# Patient Record
Sex: Female | Born: 1965 | Race: White | Hispanic: No | Marital: Single | Smoking: Current every day smoker
Health system: Southern US, Community
[De-identification: ages and names within clinical notes are randomized; demographics above are authoritative.]

## PROBLEM LIST (undated history)

## (undated) DIAGNOSIS — E119 Type 2 diabetes mellitus without complications: Secondary | ICD-10-CM

## (undated) DIAGNOSIS — T451X5A Adverse effect of antineoplastic and immunosuppressive drugs, initial encounter: Secondary | ICD-10-CM

## (undated) DIAGNOSIS — F32A Depression, unspecified: Secondary | ICD-10-CM

## (undated) DIAGNOSIS — M21372 Foot drop, left foot: Secondary | ICD-10-CM

## (undated) DIAGNOSIS — Z923 Personal history of irradiation: Secondary | ICD-10-CM

## (undated) DIAGNOSIS — I10 Essential (primary) hypertension: Secondary | ICD-10-CM

## (undated) DIAGNOSIS — M1990 Unspecified osteoarthritis, unspecified site: Secondary | ICD-10-CM

## (undated) DIAGNOSIS — F329 Major depressive disorder, single episode, unspecified: Secondary | ICD-10-CM

## (undated) DIAGNOSIS — S82841A Displaced bimalleolar fracture of right lower leg, initial encounter for closed fracture: Secondary | ICD-10-CM

## (undated) DIAGNOSIS — Z9641 Presence of insulin pump (external) (internal): Secondary | ICD-10-CM

## (undated) DIAGNOSIS — G620 Drug-induced polyneuropathy: Secondary | ICD-10-CM

## (undated) DIAGNOSIS — T8859XA Other complications of anesthesia, initial encounter: Secondary | ICD-10-CM

## (undated) DIAGNOSIS — G4730 Sleep apnea, unspecified: Secondary | ICD-10-CM

## (undated) DIAGNOSIS — E785 Hyperlipidemia, unspecified: Secondary | ICD-10-CM

## (undated) DIAGNOSIS — Z9221 Personal history of antineoplastic chemotherapy: Secondary | ICD-10-CM

## (undated) DIAGNOSIS — M5416 Radiculopathy, lumbar region: Secondary | ICD-10-CM

## (undated) DIAGNOSIS — K219 Gastro-esophageal reflux disease without esophagitis: Secondary | ICD-10-CM

## (undated) DIAGNOSIS — C539 Malignant neoplasm of cervix uteri, unspecified: Secondary | ICD-10-CM

## (undated) DIAGNOSIS — K861 Other chronic pancreatitis: Secondary | ICD-10-CM

## (undated) DIAGNOSIS — M179 Osteoarthritis of knee, unspecified: Secondary | ICD-10-CM

## (undated) DIAGNOSIS — A048 Other specified bacterial intestinal infections: Secondary | ICD-10-CM

## (undated) HISTORY — DX: Malignant neoplasm of cervix uteri, unspecified: C53.9

## (undated) HISTORY — DX: Hyperlipidemia, unspecified: E78.5

## (undated) HISTORY — PX: CHOLECYSTECTOMY: SHX55

## (undated) HISTORY — DX: Essential (primary) hypertension: I10

## (undated) HISTORY — PX: COLONOSCOPY: SHX174

## (undated) HISTORY — DX: Other specified bacterial intestinal infections: A04.8

## (undated) HISTORY — DX: Major depressive disorder, single episode, unspecified: F32.9

## (undated) HISTORY — PX: UPPER GI ENDOSCOPY: SHX6162

## (undated) HISTORY — PX: OOPHORECTOMY: SHX86

## (undated) HISTORY — DX: Depression, unspecified: F32.A

## (undated) HISTORY — DX: Type 2 diabetes mellitus without complications: E11.9

## (undated) HISTORY — PX: ABDOMINAL HYSTERECTOMY: SHX81

---

## 2004-05-13 ENCOUNTER — Emergency Department: Payer: Self-pay | Admitting: Unknown Physician Specialty

## 2004-06-22 ENCOUNTER — Emergency Department: Payer: Self-pay | Admitting: Emergency Medicine

## 2004-12-23 ENCOUNTER — Emergency Department: Payer: Self-pay | Admitting: Emergency Medicine

## 2005-05-08 ENCOUNTER — Emergency Department: Payer: Self-pay | Admitting: Emergency Medicine

## 2007-01-09 ENCOUNTER — Other Ambulatory Visit: Payer: Self-pay

## 2007-01-09 ENCOUNTER — Emergency Department: Payer: Self-pay | Admitting: Emergency Medicine

## 2007-02-14 ENCOUNTER — Emergency Department: Payer: Self-pay | Admitting: Emergency Medicine

## 2008-08-14 ENCOUNTER — Emergency Department: Payer: Self-pay | Admitting: Emergency Medicine

## 2008-08-28 ENCOUNTER — Ambulatory Visit: Payer: Self-pay | Admitting: Family Medicine

## 2008-10-10 ENCOUNTER — Emergency Department: Payer: Self-pay | Admitting: Emergency Medicine

## 2009-01-12 ENCOUNTER — Emergency Department: Payer: Self-pay | Admitting: Emergency Medicine

## 2011-04-27 DIAGNOSIS — A048 Other specified bacterial intestinal infections: Secondary | ICD-10-CM

## 2011-04-27 HISTORY — DX: Other specified bacterial intestinal infections: A04.8

## 2011-05-01 IMAGING — CR DG CHEST 2V
1 series · 2 of 2 positions shown · non-contrast
Comparison: none

REASON FOR EXAM: abd pain
COMMENTS:

PROCEDURE:     DXR - DXR CHEST PA (OR AP) AND LATERAL  - August 28, 2008 [DATE]
RESULT:     The lung fields are clear. No pneumonia, pneumothorax or pleural
effusion is seen. Heart size is normal. The chest appears mildly
hyperexpanded.

[Series 1: view not recorded · 0.17mm/px · 2 of 2 slices shown]
[im 1/2]
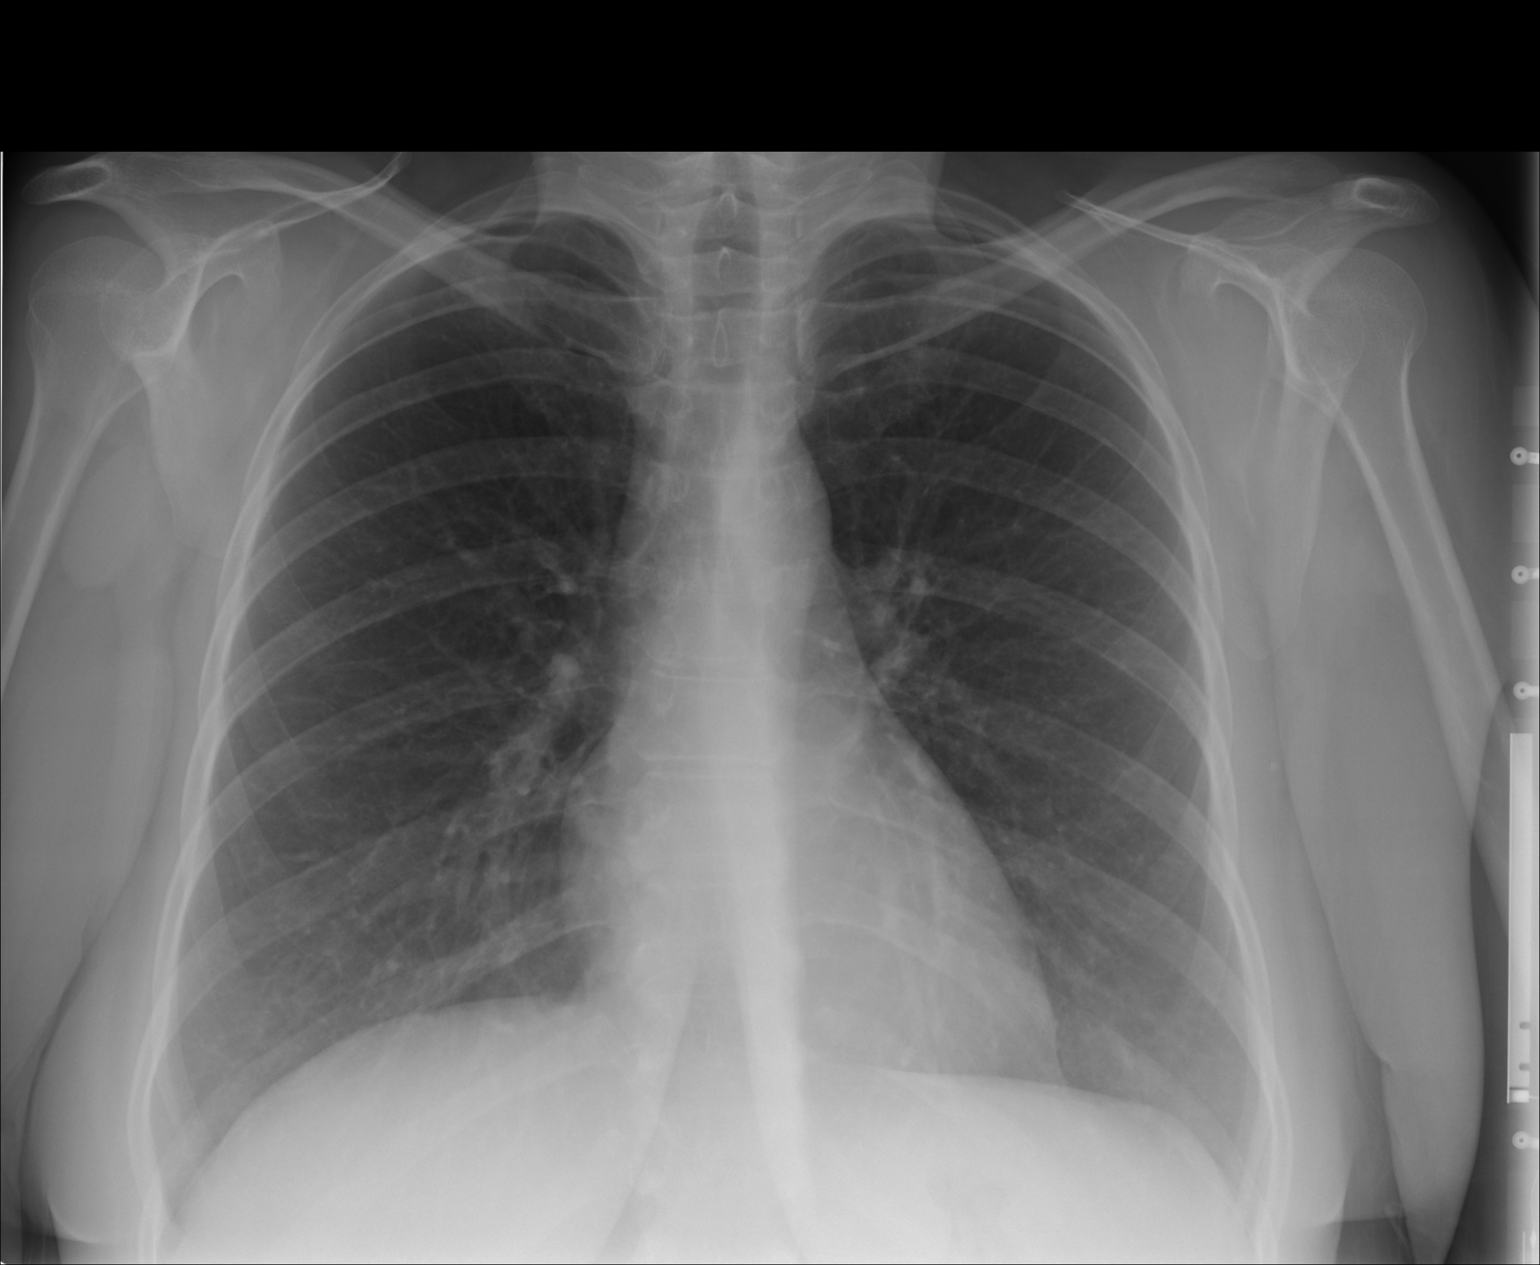
[im 2/2]
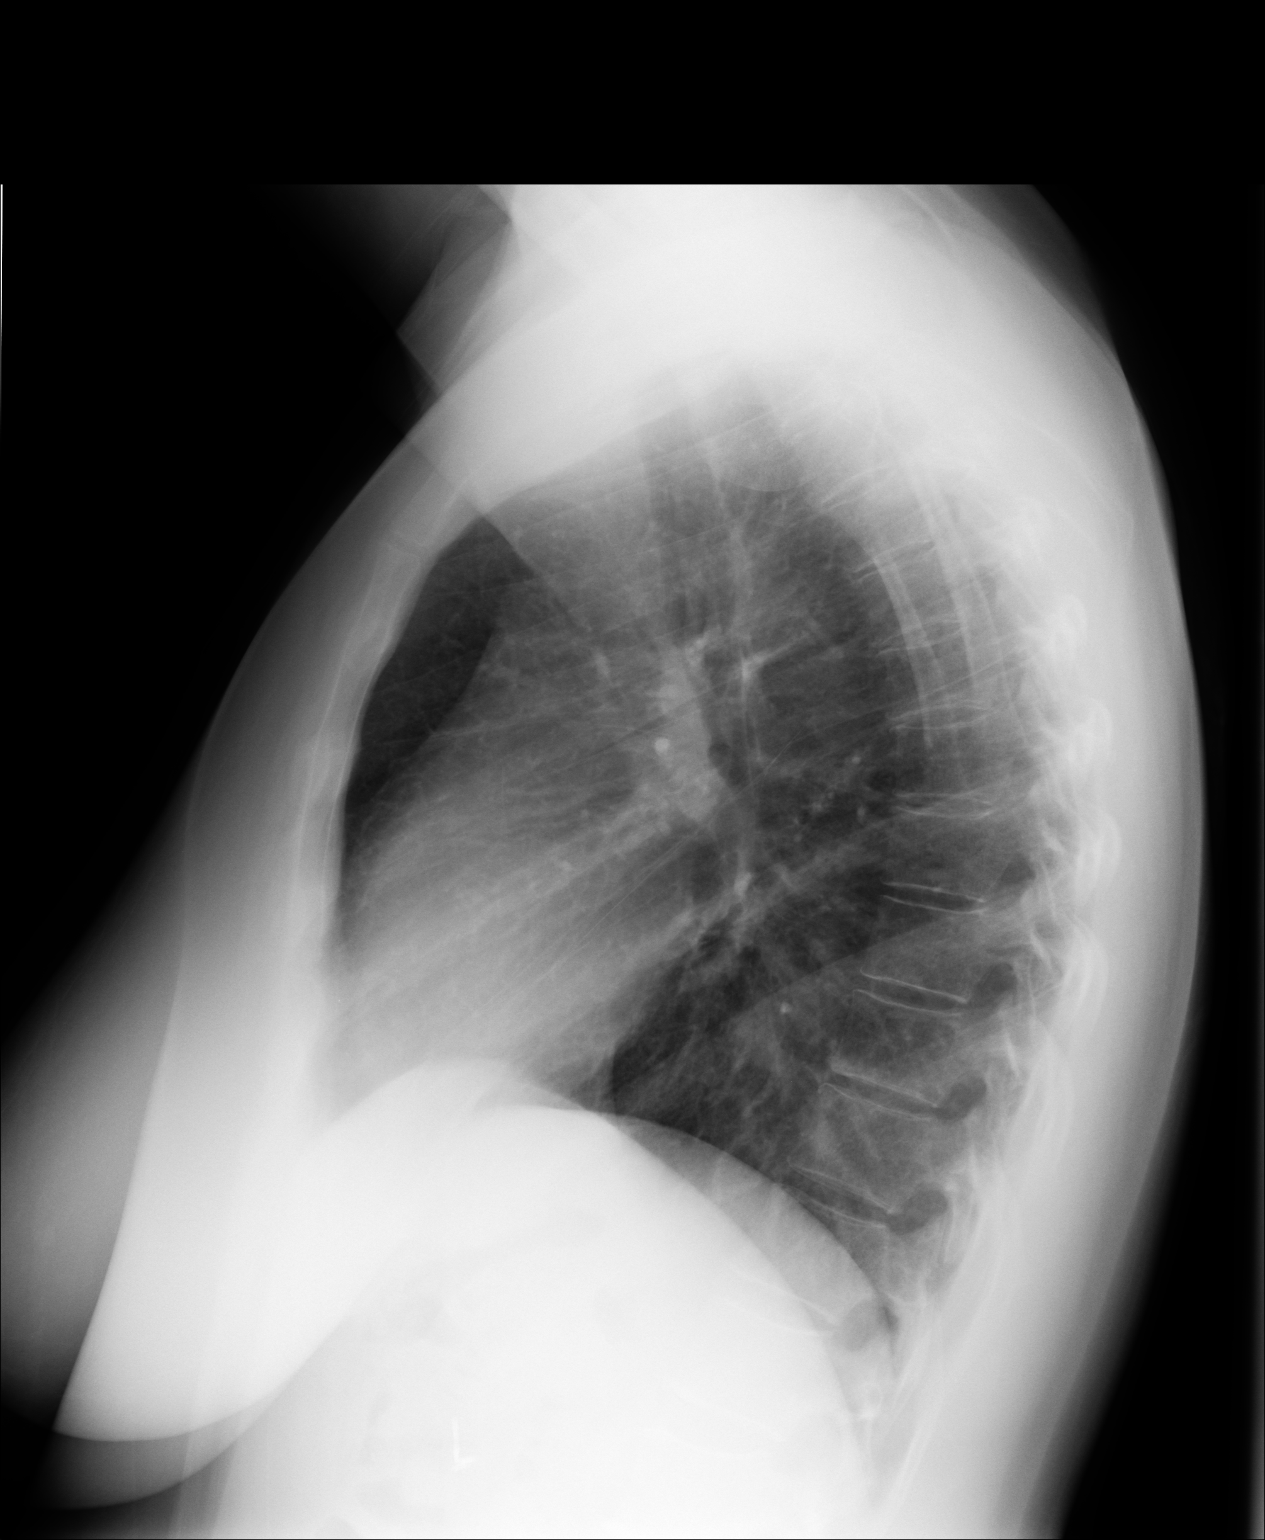

[2 of 2 positions shown; findings below may reference images not displayed]

IMPRESSION: 1. The lung fields are clear.
2. The chest appears mildly hyperexpanded.

## 2011-07-06 ENCOUNTER — Ambulatory Visit: Payer: Self-pay | Admitting: Cardiology

## 2011-08-23 ENCOUNTER — Ambulatory Visit: Payer: Self-pay | Admitting: Family Medicine

## 2011-11-06 LAB — COMPREHENSIVE METABOLIC PANEL
Albumin: 3.5 g/dL (ref 3.4–5.0)
Alkaline Phosphatase: 119 U/L (ref 50–136)
Anion Gap: 7 (ref 7–16)
BUN: 7 mg/dL (ref 7–18)
Bilirubin,Total: 0.8 mg/dL (ref 0.2–1.0)
Calcium, Total: 9.4 mg/dL (ref 8.5–10.1)
Chloride: 99 mmol/L (ref 98–107)
Co2: 30 mmol/L (ref 21–32)
Creatinine: 0.77 mg/dL (ref 0.60–1.30)
EGFR (African American): >60
EGFR (Non-African Amer.): >60
Glucose: 152 mg/dL — ABNORMAL HIGH (ref 65–99)
Osmolality: 273 (ref 275–301)
Potassium: 4 mmol/L (ref 3.5–5.1)
SGOT(AST): 21 U/L (ref 15–37)
SGPT (ALT): 21 U/L
Sodium: 136 mmol/L (ref 136–145)
Total Protein: 9 g/dL — ABNORMAL HIGH (ref 6.4–8.2)

## 2011-11-06 LAB — CBC
HCT: 36.8 % (ref 35.0–47.0)
HGB: 12 g/dL (ref 12.0–16.0)
MCH: 29 pg (ref 26.0–34.0)
MCV: 89 fL (ref 80–100)
Platelet: 380 10*3/uL (ref 150–440)
RBC: 4.15 10*6/uL (ref 3.80–5.20)

## 2011-11-07 ENCOUNTER — Inpatient Hospital Stay: Payer: Self-pay | Admitting: Internal Medicine

## 2011-11-08 LAB — CBC WITH DIFFERENTIAL/PLATELET
Basophil #: 0.1 10*3/uL (ref 0.0–0.1)
Basophil %: 0.6 %
Eosinophil #: 1.1 10*3/uL — ABNORMAL HIGH (ref 0.0–0.7)
Eosinophil %: 7.6 %
HCT: 31.6 % — ABNORMAL LOW (ref 35.0–47.0)
Lymphocyte #: 2.2 10*3/uL (ref 1.0–3.6)
Lymphocyte %: 15.2 %
MCH: 29.7 pg (ref 26.0–34.0)
MCHC: 33.8 g/dL (ref 32.0–36.0)
Monocyte %: 6 %
Neutrophil #: 10.3 10*3/uL — ABNORMAL HIGH (ref 1.4–6.5)
Platelet: 371 10*3/uL (ref 150–440)
RBC: 3.59 10*6/uL — ABNORMAL LOW (ref 3.80–5.20)
RDW: 13.5 % (ref 11.5–14.5)
WBC: 14.5 10*3/uL — ABNORMAL HIGH (ref 3.6–11.0)

## 2011-11-08 LAB — BASIC METABOLIC PANEL
Anion Gap: 6 — ABNORMAL LOW (ref 7–16)
Chloride: 101 mmol/L (ref 98–107)
Co2: 30 mmol/L (ref 21–32)
Creatinine: 0.82 mg/dL (ref 0.60–1.30)
Potassium: 3.8 mmol/L (ref 3.5–5.1)
Sodium: 137 mmol/L (ref 136–145)

## 2011-11-20 LAB — CULTURE, BLOOD (SINGLE)

## 2012-01-07 ENCOUNTER — Ambulatory Visit: Payer: Self-pay | Admitting: Hematology and Oncology

## 2012-01-07 LAB — CBC CANCER CENTER
Basophil %: 1 %
Eosinophil #: 0.9 x10 3/mm — ABNORMAL HIGH (ref 0.0–0.7)
Eosinophil %: 8.4 %
HGB: 12.5 g/dL (ref 12.0–16.0)
Lymphocyte #: 2.6 x10 3/mm (ref 1.0–3.6)
Lymphocyte %: 24.5 %
MCHC: 32.4 g/dL (ref 32.0–36.0)
Monocyte #: 0.7 x10 3/mm (ref 0.2–0.9)
Neutrophil #: 6.2 x10 3/mm (ref 1.4–6.5)
Neutrophil %: 59.7 %
Platelet: 378 x10 3/mm (ref 150–440)
RDW: 14.2 % (ref 11.5–14.5)
WBC: 10.5 x10 3/mm (ref 3.6–11.0)

## 2012-01-25 ENCOUNTER — Ambulatory Visit: Payer: Self-pay | Admitting: Hematology and Oncology

## 2012-06-24 ENCOUNTER — Ambulatory Visit: Payer: Self-pay | Admitting: Hematology and Oncology

## 2013-02-06 ENCOUNTER — Ambulatory Visit: Payer: Self-pay | Admitting: Rheumatology

## 2013-09-18 ENCOUNTER — Ambulatory Visit: Payer: Self-pay | Admitting: Gastroenterology

## 2013-09-19 LAB — PATHOLOGY REPORT

## 2014-08-13 NOTE — Discharge Summary (Signed)
PATIENT NAME:  Catherine Krueger, Catherine Krueger MR#:  [id number] DATE OF BIRTH:  05-27-1965  DATE OF ADMISSION:  11/07/2011 DATE OF DISCHARGE:  11/08/2011  PRESENTING COMPLAINT: Right leg swelling and erythema.   DISCHARGE DIAGNOSES:  1. Right lower extremity cellulitis, improved.  2. Type II diabetes.   CONDITION ON DISCHARGE: Fair.   MEDICATIONS:  1. Lovastatin 40 mg p.o. at bedtime.  2. Ultram 50 mg 3 times a day as needed for pain.  3. Amitriptyline 25 mg at bedtime.  4. Metformin 1000 mg b.i.d.  5. Lyrica 50 mg 3 times a day.  6. Meloxicam 7.5 mg b.i.d.  7. Nicotine patch inhaled once a day.  8. Lantus 55 units at bedtime.  9. Augmentin 875 mg b.i.d. for seven days.   DO NOT TAKE: Hold enalapril since your blood pressure is running low.   FOLLOW-UP: Follow-up with Dr. Tomasita Morrow at [facility] on 11/17/2011 at 10:30 a.m.   LABORATORY, DIAGNOSTIC, AND RADIOLOGICAL DATA: Blood cultures negative in 24 hours. White count 14.5, hemoglobin and hematocrit 10.7 and 31.6. Basic metabolic panel within normal limits except glucose of 166. Hemoglobin A1c 6.8. White count on admission was 22.5.   BRIEF SUMMARY OF HOSPITAL COURSE:  1. [name] is a 49 year old Caucasian female who came into the Emergency Room with history of diabetes, hypertension, and hyperlipidemia came in with right lower extremity cellulitis. She was admitted with right lower extremity cellulitis. She did have some dry skin and cracked skin in her web spaces, likely source for her infection. She was started initially on vancomycin and Zosyn and doing very well. Her erythema was receding. She did have some minor pain in her calf area. Her white count improved down from 22,000 to 14.5. She remained afebrile. Her antibiotics were changed to p.o. Augmentin.  2. Leukocytosis likely due to the cellulitis, improved.  3. Type II diabetes. Continued Lantus and metformin.  4. Hypertension. The patient has relative low blood pressure. She  is asymptomatic. Will hold her off the enalapril for now.  5. Hyperlipidemia. Continued lovastatin.   The patient was advised to call [facility] to get all her medications refilled. Her medication list was checked with RN at [facility] and changes were updated.   Hospital stay otherwise remained stable.   CODE STATUS: The patient remained a FULL CODE.   TIME SPENT: 40 minutes.   ____________________________ Hart Rochester Posey Pronto, MD sap:drc D: 11/08/2011 13:59:15 ET T: 11/08/2011 14:27:08 ET JOB#: [id number]  cc: Kayshawn Ozburn A. Posey Pronto, MD, <Dictator> Myrle Sheng. Jimmye Norman, MD Ilda Basset MD ELECTRONICALLY SIGNED 11/11/2011 13:46

## 2014-08-18 NOTE — H&P (Signed)
PATIENT NAME:  Catherine Krueger, Catherine Krueger MR#:  [id number] DATE OF BIRTH:  12-Jan-1966  DATE OF ADMISSION:  11/07/2011  REFERRING PHYSICIAN: Dr. Lisa Roca  PRIMARY CARE PHYSICIAN: Dr. Tomasita Morrow    CHIEF COMPLAINT: Right leg pain, erythema, swelling.   HISTORY OF PRESENT ILLNESS: This is a 49 year old female with significant past medical history of diabetes, hypertension, diabetic neuropathy and depression presents with complaint of right lower extremity pain, tenderness, erythema over the last two days. Patient denies any insect bites or trauma or injury to her right leg. As well denies any recent long travel, hemoptysis, shortness of breath or chest pain. Patient was found to have leukocytosis of 22.5. Patient as well complaining of feverish and chills. Temperature in ED was 99.8. Patient's blood cultures were sent and was started on IV clindamycin for cellulitis. Patient had right lower extremity venous Doppler which came back negative for deep vein thrombosis.   Hospitalist was requested to admit patient as she is diabetic and she will need IV antibiotics.   PAST MEDICAL HISTORY:  1. Benign hypertension.  2. Diabetes mellitus.  3. Depression.  4. History of cervical cancer, status post chemotherapy and radiation at [facility].  5. Diabetic neuropathy.   HOME MEDICATIONS:  1. Enalapril 2.5 mg oral once a day. Patient is off for last week.  2. Tramadol 50 mg oral every six hours as needed.  3. Lantus 70 units sub-Q at bedtime.  4. Amitriptyline 25 mg at night.  5. Lovastatin 40 mg daily.  6. Pepcid 10 mg daily.  7. Metformin 1000 mg 2 times a day.   ALLERGIES: Keflex, Voltaren.   SOCIAL HISTORY: Patient is single, unemployed, has three children. Smokes a pack a day for the last 28 years. No ethanol. No illicit drug use.   FAMILY HISTORY: Mother has history of diabetes and congestive heart failure.   REVIEW OF SYSTEMS: CONSTITUTIONAL: Patient complains of feeling feverish, generally  weak and fatigued. EYES: Denies blurry vision, double vision or pain. ENT: Denies any tinnitus, ear pain, hearing loss. RESPIRATORY: Denies cough, wheezing, hemoptysis, dyspnea. CARDIOVASCULAR: Denies any chest pain, orthopnea, palpitations, syncope. GASTROINTESTINAL: Denies any nausea, vomiting, diarrhea, abdominal pain. GENITOURINARY: Denies dysuria, hematuria, renal colic. ENDO: Denies polyuria, polydipsia, heat or cold intolerance. INTEGUMENTARY: Had right lower extremity tenderness and rash. Denies any itching. MUSCULOSKELETAL: Denies any neck pain, back pain, gout. NEUROLOGICAL: Denies any numbness, weakness, dysarthria, epilepsy. Has diabetic neuropathy. PSYCH: Denies any anxiety, insomnia. Has history of depression.   PHYSICAL EXAMINATION:  VITAL SIGNS: Temperature 99.8, pulse 110, respiratory rate 20, blood pressure 142/76, saturating 99%.   GENERAL: Well-nourished female looks comfortable in bed in no apparent distress.   HEENT: Head atraumatic, normocephalic. Pupils equal, reactive to light. Pink conjunctivae. Anicteric sclerae. Moist oral mucosa.   NECK: Supple. No thyromegaly. No JVD.   CHEST: Good air entry bilaterally. No wheezing, rales, rhonchi.   CARDIOVASCULAR: S1, S2 heard. No rubs, murmur, gallops.   ABDOMEN: Soft, nontender, nondistended. Bowel sounds present.   EXTREMITIES: Good pulses. No clubbing. No cyanosis. Right lower extremity has medial erythema extending below and above the knee, tender, warm.   PSYCHIATRIC: Appropriate affect. Awake, alert x3. Intact judgment and insight.   NEUROLOGIC: Cranial nerves grossly intact. Motor 5/5.   SKIN: Has right lower extremity rash.   LABORATORY, DIAGNOSTIC AND RADIOLOGICAL DATA: Glucose 152, BUN 7, creatinine 0.77, sodium 136, potassium 4, chloride 99, CO2 30, total protein 9, albumin 3.5, total bilirubin 0.8, alkaline phosphatase 119, AST 21,  ALT 21, white blood cells 22.5, hemoglobin 12, hematocrit 36.8, platelets 380.    Right lower extremity venous Doppler showing no evidence of deep vein thrombosis in right lower extremity. As well there is a large hypervascular lymph node in the right groin.   ASSESSMENT AND PLAN:  1. Sepsis. Patient has tachycardia of 110 and leukocytosis of 22.5. Source most likely due to cellulitis and much less likely thrombophlebitis. Will start her on broad-spectrum IV antibiotic. Giving that she is diabetic will start on Zosyn and vancomycin and will down escalate antibiotics once blood cultures are back. Will follow on blood cultures and will have on ibuprofen 400 every eight hours total of three days for possible thrombophlebitis as well.  2. Diabetes. Will continue on metformin and Lantus and will add insulin sliding scale.  3. Diabetic neuropathy. Will continue on Lyrica.  4. Depression. Will continue on amitriptyline.  5. Hyperlipidemia. Continue on statin.  6. Hypertension. Continue on enalapril.  7. Continue on sub-Q heparin for deep vein thrombosis prophylaxis and Protonix for GI prophylaxis especially patient is on NSAIDs.  8. CODE STATUS: FULL CODE.   TOTAL TIME SPENT ON ADMISSION AND PATIENT CARE: 50 minutes.   ____________________________ Albertine Patricia, MD dse:cms D: 11/07/2011 02:13:48 ET T: 11/07/2011 09:58:01 ET JOB#: [id number]  cc: Albertine Patricia, MD, <Dictator> Myrle Sheng. Jimmye Norman, MD Aloys Hupfer Graciela Husbands MD ELECTRONICALLY SIGNED 11/08/2011 0:28

## 2014-09-19 ENCOUNTER — Encounter (HOSPITAL_COMMUNITY): Payer: Self-pay

## 2014-10-01 ENCOUNTER — Other Ambulatory Visit: Payer: Self-pay

## 2014-10-01 ENCOUNTER — Ambulatory Visit: Payer: Medicare Other | Admitting: Psychiatry

## 2015-02-22 ENCOUNTER — Inpatient Hospital Stay
Admission: EM | Admit: 2015-02-22 | Discharge: 2015-02-24 | DRG: 637 | Disposition: A | Discharge status: Home / Self Care | Payer: Medicare Other | Attending: Internal Medicine | Admitting: Internal Medicine

## 2015-02-22 ENCOUNTER — Emergency Department: Payer: Medicare Other

## 2015-02-22 ENCOUNTER — Encounter: Payer: Self-pay | Admitting: Emergency Medicine

## 2015-02-22 DIAGNOSIS — E669 Obesity, unspecified: Secondary | ICD-10-CM

## 2015-02-22 DIAGNOSIS — Z9114 Patient's other noncompliance with medication regimen: Secondary | ICD-10-CM | POA: Diagnosis not present

## 2015-02-22 DIAGNOSIS — I10 Essential (primary) hypertension: Secondary | ICD-10-CM | POA: Diagnosis present

## 2015-02-22 DIAGNOSIS — Z720 Tobacco use: Secondary | ICD-10-CM

## 2015-02-22 DIAGNOSIS — E860 Dehydration: Secondary | ICD-10-CM

## 2015-02-22 DIAGNOSIS — R410 Disorientation, unspecified: Secondary | ICD-10-CM

## 2015-02-22 DIAGNOSIS — F17200 Nicotine dependence, unspecified, uncomplicated: Secondary | ICD-10-CM | POA: Diagnosis present

## 2015-02-22 DIAGNOSIS — N390 Urinary tract infection, site not specified: Secondary | ICD-10-CM | POA: Diagnosis present

## 2015-02-22 DIAGNOSIS — F329 Major depressive disorder, single episode, unspecified: Secondary | ICD-10-CM | POA: Diagnosis present

## 2015-02-22 DIAGNOSIS — E1310 Other specified diabetes mellitus with ketoacidosis without coma: Secondary | ICD-10-CM | POA: Diagnosis present

## 2015-02-22 DIAGNOSIS — E1165 Type 2 diabetes mellitus with hyperglycemia: Secondary | ICD-10-CM | POA: Diagnosis present

## 2015-02-22 DIAGNOSIS — K219 Gastro-esophageal reflux disease without esophagitis: Secondary | ICD-10-CM | POA: Diagnosis present

## 2015-02-22 DIAGNOSIS — Z9049 Acquired absence of other specified parts of digestive tract: Secondary | ICD-10-CM

## 2015-02-22 DIAGNOSIS — Z8249 Family history of ischemic heart disease and other diseases of the circulatory system: Secondary | ICD-10-CM

## 2015-02-22 DIAGNOSIS — Z794 Long term (current) use of insulin: Secondary | ICD-10-CM

## 2015-02-22 DIAGNOSIS — Z881 Allergy status to other antibiotic agents status: Secondary | ICD-10-CM | POA: Diagnosis not present

## 2015-02-22 DIAGNOSIS — Z7951 Long term (current) use of inhaled steroids: Secondary | ICD-10-CM

## 2015-02-22 DIAGNOSIS — E1110 Type 2 diabetes mellitus with ketoacidosis without coma: Secondary | ICD-10-CM

## 2015-02-22 DIAGNOSIS — Z79899 Other long term (current) drug therapy: Secondary | ICD-10-CM

## 2015-02-22 DIAGNOSIS — E785 Hyperlipidemia, unspecified: Secondary | ICD-10-CM | POA: Diagnosis present

## 2015-02-22 DIAGNOSIS — E876 Hypokalemia: Secondary | ICD-10-CM | POA: Diagnosis present

## 2015-02-22 DIAGNOSIS — N3000 Acute cystitis without hematuria: Secondary | ICD-10-CM | POA: Diagnosis present

## 2015-02-22 DIAGNOSIS — Z90710 Acquired absence of both cervix and uterus: Secondary | ICD-10-CM

## 2015-02-22 DIAGNOSIS — Z888 Allergy status to other drugs, medicaments and biological substances status: Secondary | ICD-10-CM

## 2015-02-22 DIAGNOSIS — Z8541 Personal history of malignant neoplasm of cervix uteri: Secondary | ICD-10-CM | POA: Diagnosis not present

## 2015-02-22 DIAGNOSIS — E871 Hypo-osmolality and hyponatremia: Secondary | ICD-10-CM | POA: Diagnosis present

## 2015-02-22 DIAGNOSIS — N179 Acute kidney failure, unspecified: Secondary | ICD-10-CM | POA: Diagnosis present

## 2015-02-22 DIAGNOSIS — Z833 Family history of diabetes mellitus: Secondary | ICD-10-CM | POA: Diagnosis not present

## 2015-02-22 DIAGNOSIS — D649 Anemia, unspecified: Secondary | ICD-10-CM

## 2015-02-22 DIAGNOSIS — D72829 Elevated white blood cell count, unspecified: Secondary | ICD-10-CM

## 2015-02-22 DIAGNOSIS — F32A Depression, unspecified: Secondary | ICD-10-CM | POA: Diagnosis present

## 2015-02-22 DIAGNOSIS — G9341 Metabolic encephalopathy: Secondary | ICD-10-CM | POA: Diagnosis present

## 2015-02-22 HISTORY — DX: Gastro-esophageal reflux disease without esophagitis: K21.9

## 2015-02-22 LAB — CBC
HEMATOCRIT: 34.8 % — AB (ref 35.0–47.0)
Hemoglobin: 11.5 g/dL — ABNORMAL LOW (ref 12.0–16.0)
MCH: 29.1 pg (ref 26.0–34.0)
MCHC: 32.9 g/dL (ref 32.0–36.0)
MCV: 88.4 fL (ref 80.0–100.0)
Platelets: 428 10*3/uL (ref 150–440)
RBC: 3.94 MIL/uL (ref 3.80–5.20)
RDW: 13.4 % (ref 11.5–14.5)
WBC: 13.1 10*3/uL — AB (ref 3.6–11.0)

## 2015-02-22 LAB — URINALYSIS COMPLETE WITH MICROSCOPIC (ARMC ONLY)
BILIRUBIN URINE: NEGATIVE
Hgb urine dipstick: NEGATIVE
NITRITE: POSITIVE — AB
Protein, ur: NEGATIVE mg/dL
SPECIFIC GRAVITY, URINE: 1.017 (ref 1.005–1.030)
pH: 5 (ref 5.0–8.0)

## 2015-02-22 LAB — BASIC METABOLIC PANEL
ANION GAP: 19 — AB (ref 5–15)
BUN: 35 mg/dL — AB (ref 6–20)
CO2: 21 mmol/L — ABNORMAL LOW (ref 22–32)
Calcium: 9.4 mg/dL (ref 8.9–10.3)
Chloride: 79 mmol/L — ABNORMAL LOW (ref 101–111)
Creatinine, Ser: 1.7 mg/dL — ABNORMAL HIGH (ref 0.44–1.00)
GFR calc Af Amer: 40 mL/min — ABNORMAL LOW (ref >60)
GFR, EST NON AFRICAN AMERICAN: 34 mL/min — AB (ref >60)
Glucose, Bld: 685 mg/dL (ref 65–99)
POTASSIUM: 4.4 mmol/L (ref 3.5–5.1)
SODIUM: 119 mmol/L — AB (ref 135–145)

## 2015-02-22 LAB — GLUCOSE, CAPILLARY
GLUCOSE-CAPILLARY: 582 mg/dL — AB (ref 65–99)
Glucose-Capillary: 484 mg/dL — ABNORMAL HIGH (ref 65–99)

## 2015-02-22 LAB — BLOOD GAS, VENOUS
ACID-BASE DEFICIT: 0.4 mmol/L (ref 0.0–2.0)
BICARBONATE: 25.4 meq/L (ref 21.0–28.0)
FIO2: 0.21
PATIENT TEMPERATURE: 37
pCO2, Ven: 45 mmHg (ref 44.0–60.0)
pH, Ven: 7.36 (ref 7.320–7.430)

## 2015-02-22 MED ORDER — SODIUM CHLORIDE 0.9 % IV SOLN
1000.0000 mL | Freq: Once | INTRAVENOUS | Status: AC
  Administered 2015-02-22: 1000 mL via INTRAVENOUS

## 2015-02-22 MED ORDER — SODIUM CHLORIDE 0.9 % IV BOLUS (SEPSIS)
1000.0000 mL | Freq: Once | INTRAVENOUS | Status: AC
  Administered 2015-02-22: 1000 mL via INTRAVENOUS

## 2015-02-22 MED ORDER — SODIUM CHLORIDE 0.9 % IV SOLN
INTRAVENOUS | Status: DC
  Administered 2015-02-22: 22:00:00 via INTRAVENOUS
  Administered 2015-02-22: 4.2 [IU]/h via INTRAVENOUS
  Filled 2015-02-22: qty 2.5

## 2015-02-22 MED ORDER — LEVOFLOXACIN IN D5W 750 MG/150ML IV SOLN
750.0000 mg | INTRAVENOUS | Status: DC
  Administered 2015-02-22 – 2015-02-24 (×2): 750 mg via INTRAVENOUS
  Filled 2015-02-22 (×4): qty 150

## 2015-02-22 NOTE — ED Notes (Signed)
RT, [place], called for [facility] pick up

## 2015-02-22 NOTE — ED Provider Notes (Signed)
[facility] Emergency Department Provider Note  ____________________________________________  Time seen: On arrival  I have reviewed the triage vital signs and the nursing notes.   HISTORY  Chief Complaint Hyperglycemia    HPI Catherine Krueger is a 49 y.o. female who presents with hyperglycemia. She reports thatshe has felt weak with low energy for the past week. She reports that many things have been going on in her life and she has not been keeping up with her medications as she should. She denies fevers chills. No cough or shortness of breath. No dysuria. She reports polyuria and polydipsia. No abdominal pain     Past Medical History  Diagnosis Date  . Depression   . Diabetes mellitus, type II ([place])   . Hypertension   . HLD (hyperlipidemia)   . Cervical cancer (HCC)     remission, dx at age 70  . H. pylori infection 2013    There are no active problems to display for this patient.   Past Surgical History  Procedure Laterality Date  . Abdominal hysterectomy    . Cholecystectomy      Current Outpatient Rx  Name  Route  Sig  Dispense  Refill  . acetaminophen (TYLENOL) 500 MG tablet   Oral   Take 500 mg by mouth every 6 (six) hours as needed.         Marland Kitchen atorvastatin (LIPITOR) 40 MG tablet   Oral   Take 40 mg by mouth daily.         Marland Kitchen ESOMEPRAZOLE MAGNESIUM PO   Oral   Take 1 tablet by mouth daily.         . fluticasone (FLONASE) 50 MCG/ACT nasal spray   Each Nare   Place 2 sprays into both nostrils daily.         Marland Kitchen gabapentin (NEURONTIN) 100 MG capsule      100 mg. One tablet in am and afternoon One tablet at bedtime         . hydrochlorothiazide (MICROZIDE) 12.5 MG capsule               . hydrOXYzine (ATARAX/VISTARIL) 10 MG tablet   Oral   Take 10 mg by mouth 3 (three) times daily as needed.         . Insulin Glargine (LANTUS) 100 UNIT/ML Solostar Pen   Subcutaneous   Inject 80 Units into the skin daily.        Marland Kitchen lisinopril (PRINIVIL,ZESTRIL) 40 MG tablet   Oral   Take 40 mg by mouth daily.         . metFORMIN (GLUCOPHAGE) 1000 MG tablet   Oral   Take 1,000 mg by mouth 2 (two) times daily with a meal.         . omeprazole (PRILOSEC) 40 MG capsule               . RELION PEN NEEDLES 29G X 12MM MISC                 Dispense as written.   . sucralfate (CARAFATE) 1 G tablet   Oral   Take 1 g by mouth 2 (two) times daily.         Marland Kitchen venlafaxine XR (EFFEXOR-XR) 150 MG 24 hr capsule   Oral   Take 150 mg by mouth daily with breakfast.           Allergies Keflex  No family history on file.  Social History Social  History  Substance Use Topics  . Smoking status: Current Every Day Smoker -- 0.50 packs/day    Types: Cigarettes  . Smokeless tobacco: Never Used  . Alcohol Use: No    Review of Systems  Constitutional: Negative for fever. Eyes: Negative for visual changes. ENT: Positive for dry mouth Cardiovascular: Negative for chest pain. Respiratory: Negative for shortness of breath. Gastrointestinal: Negative for abdominal pain, vomiting and diarrhea. Genitourinary: Negative for dysuria. I'll defer polyuria Musculoskeletal: Negative for back pain. Skin: Negative for rash. Neurological: Negative for headaches or focal weakness Psychiatric: Mild depression    ____________________________________________   PHYSICAL EXAM:  VITAL SIGNS: ED Triage Vitals  Enc Vitals Group     BP 02/22/15 1958 96/52 mmHg     Pulse Rate 02/22/15 1958 112     Resp 02/22/15 1958 18     Temp 02/22/15 1958 98.5 F (36.9 C)     Temp Source 02/22/15 1958 Oral     SpO2 02/22/15 1958 99 %     Weight 02/22/15 1958 180 lb (81.647 kg)     Height 02/22/15 1958 5\' 2"  (1.575 m)     Head Cir --      Peak Flow --      Pain Score 02/22/15 2002 0     Pain Loc --      Pain Edu? --      Excl. in [place]? --      Constitutional: Alert and oriented. No acute distress Eyes:  Conjunctivae are normal.  ENT   Head: Normocephalic and atraumatic.   Mouth/Throat: Mucous membranes are dry Cardiovascular: Positive tachycardia, regular rhythm. Normal and symmetric distal pulses are present in all extremities. No murmurs, rubs, or gallops. Respiratory: Normal respiratory effort without tachypnea nor retractions. Breath sounds are clear and equal bilaterally.  Gastrointestinal: Soft and non-tender in all quadrants. No distention. There is no CVA tenderness. Genitourinary: deferred Musculoskeletal: Nontender with normal range of motion in all extremities. No lower extremity tenderness nor edema. Neurologic:  Normal speech and language. No gross focal neurologic deficits are appreciated. Skin:  Skin is warm, dry and intact. No rash noted. Psychiatric: Mood and affect are normal. Patient exhibits appropriate insight and judgment.  ____________________________________________    LABS (pertinent positives/negatives)  Labs Reviewed  GLUCOSE, CAPILLARY - Abnormal; Notable for the following:    Glucose-Capillary >600 (*)    All other components within normal limits  GLUCOSE, CAPILLARY - Abnormal; Notable for the following:    Glucose-Capillary >600 (*)    All other components within normal limits  BASIC METABOLIC PANEL - Abnormal; Notable for the following:    Sodium 119 (*)    Chloride 79 (*)    CO2 21 (*)    Glucose, Bld 685 (*)    BUN 35 (*)    Creatinine, Ser 1.70 (*)    GFR calc non Af Amer 34 (*)    GFR calc Af Amer 40 (*)    Anion gap 19 (*)    All other components within normal limits  CBC - Abnormal; Notable for the following:    WBC 13.1 (*)    Hemoglobin 11.5 (*)    HCT 34.8 (*)    All other components within normal limits  URINALYSIS COMPLETEWITH MICROSCOPIC ([facility] ONLY)  BLOOD GAS, VENOUS  CBG MONITORING, ED    ____________________________________________   EKG  ED ECG REPORT I, Lavonia Drafts, the attending physician, personally  viewed and interpreted this ECG.  Date: 02/22/2015 EKG Time: 10:19 PM  Rate: 83 Rhythm: normal sinus rhythm QRS Axis: normal Intervals: normal ST/T Wave abnormalities: normal Conduction Disutrbances: none Narrative Interpretation: unremarkable   ____________________________________________    RADIOLOGY I have personally reviewed any xrays that were ordered on this patient: Chest x-ray unremarkable  ____________________________________________   PROCEDURES  Procedure(s) performed: none  Critical Care performed: yes  CRITICAL CARE Performed by: Lavonia Drafts   Total critical care time: 30 minutes  Critical care time was exclusive of separately billable procedures and treating other patients.  Critical care was necessary to treat or prevent imminent or life-threatening deterioration.  Critical care was time spent personally by me on the following activities: development of treatment plan with patient and/or surrogate as well as nursing, discussions with consultants, evaluation of patient's response to treatment, examination of patient, obtaining history from patient or surrogate, ordering and performing treatments and interventions, ordering and review of laboratory studies, ordering and review of radiographic studies, pulse oximetry and re-evaluation of patient's condition.   ____________________________________________   INITIAL IMPRESSION / ASSESSMENT AND PLAN / ED COURSE  Pertinent labs & imaging results that were available during my care of the patient were reviewed by me and considered in my medical decision making (see chart for details).  Patient presents with glucose greater than 600 and elevated anion gap consistent with diabetic ketoacidosis. She was given 2 L normal saline followed by an insulin drip. We will admit her to the hospital for further management. No evidence of infection  ____________________________________________   FINAL CLINICAL  IMPRESSION(S) / ED DIAGNOSES  Final diagnoses:  Diabetic ketoacidosis without coma associated with type 2 diabetes mellitus ([place])     Lavonia Drafts, MD 02/22/15 2223

## 2015-02-22 NOTE — ED Notes (Signed)
Reports intermittent sore throat and thirstiness x 1 week, fatigue, sleeping all of the time.

## 2015-02-22 NOTE — H&P (Addendum)
[facility] [place] NAME: Catherine Krueger    MR#:  [id number]  DATE OF BIRTH:  1965/05/02  DATE OF ADMISSION:  02/22/2015  PRIMARY CARE PHYSICIAN: [employer] [facility]   REQUESTING/REFERRING PHYSICIAN: Corky Downs, M.D.  CHIEF COMPLAINT:   Chief Complaint  Patient presents with  . Hyperglycemia    HISTORY OF PRESENT ILLNESS:  Catherine Krueger  is a 49 y.o. female who presents with DKA. Patient states that she has not been taking her medications appropriately for the past 2-3 weeks. She states that for the past 5 days she has been sleeping a lot, and urinating a lot. As she began to sleep more frequently and for longer periods, she decided to come in to the ED for evaluation. Here she was found to be in DKA with a blood glucose in the 600s and an anion gap of 19, and other corresponding electrolyte derangements. She was also found to be nitrite positive on UA. She does admit to some mild dysuria as well as some subjective fevers over the last 2-3 days. She also has some AKI with a creatinine elevated to 1.7. Hospitalists were called for admission for treatment of DKA in UTI.  PAST MEDICAL HISTORY:   Past Medical History  Diagnosis Date  . Depression   . Diabetes mellitus, type II ([place])   . Hypertension   . HLD (hyperlipidemia)   . Cervical cancer (HCC)     remission, dx at age 58  . H. pylori infection 2013  . GERD (gastroesophageal reflux disease)     PAST SURGICAL HISTORY:   Past Surgical History  Procedure Laterality Date  . Abdominal hysterectomy    . Cholecystectomy      SOCIAL HISTORY:   Social History  Substance Use Topics  . Smoking status: Current Every Day Smoker -- 0.50 packs/day    Types: Cigarettes  . Smokeless tobacco: Never Used  . Alcohol Use: No    FAMILY HISTORY:   Family History  Problem Relation Age of Onset  . Diabetes Mother   . Heart attack Father   . Heart attack Brother   . Diabetes  Brother     DRUG ALLERGIES:   Allergies  Allergen Reactions  . Diclofenac Sodium Other (See Comments)  . Keflex [Cephalexin] Palpitations    MEDICATIONS AT HOME:   Prior to Admission medications   Medication Sig Start Date End Date Taking? Authorizing Provider  acetaminophen (TYLENOL) 500 MG tablet Take 500 mg by mouth every 6 (six) hours as needed.   Yes Historical Provider, MD  amitriptyline (ELAVIL) 25 MG tablet Take 25 mg by mouth at bedtime.   Yes Historical Provider, MD  atorvastatin (LIPITOR) 40 MG tablet Take 40 mg by mouth daily.   Yes Historical Provider, MD  ESOMEPRAZOLE MAGNESIUM PO Take 1 tablet by mouth daily.   Yes Historical Provider, MD  fluticasone (FLONASE) 50 MCG/ACT nasal spray Place 2 sprays into both nostrils daily.   Yes Historical Provider, MD  hydrochlorothiazide (MICROZIDE) 12.5 MG capsule  09/08/14  Yes Historical Provider, MD  Insulin Glargine (LANTUS) 100 UNIT/ML Solostar Pen Inject 80 Units into the skin daily.   Yes Historical Provider, MD  lisinopril (PRINIVIL,ZESTRIL) 40 MG tablet Take 40 mg by mouth daily.   Yes Historical Provider, MD  metFORMIN (GLUCOPHAGE) 1000 MG tablet Take 1,000 mg by mouth 2 (two) times daily with a meal.   Yes Historical Provider, MD  RELION PEN NEEDLES 29G X 12MM  [place]  08/09/14  Yes Historical Provider, MD  sucralfate (CARAFATE) 1 G tablet Take 1 g by mouth 2 (two) times daily.   Yes Historical Provider, MD  venlafaxine XR (EFFEXOR-XR) 150 MG 24 hr capsule Take 150 mg by mouth daily with breakfast.   Yes Historical Provider, MD    REVIEW OF SYSTEMS:  Review of Systems  Constitutional: Positive for fever. Negative for chills, weight loss and malaise/fatigue.  HENT: Negative for ear pain, hearing loss and tinnitus.   Eyes: Negative for blurred vision, double vision, pain and redness.  Respiratory: Negative for cough, hemoptysis and shortness of breath.   Cardiovascular: Negative for chest pain, palpitations, orthopnea and leg  swelling.  Gastrointestinal: Negative for nausea, vomiting, abdominal pain, diarrhea and constipation.  Genitourinary: Positive for dysuria. Negative for frequency and hematuria.  Musculoskeletal: Negative for back pain, joint pain and neck pain.  Skin:       No acne, rash, or lesions  Neurological: Negative for dizziness, tremors, focal weakness and weakness.       Lethargy and excessive sleepiness  Endo/Heme/Allergies: Negative for polydipsia. Does not bruise/bleed easily.  Psychiatric/Behavioral: Negative for depression. The patient is not nervous/anxious and does not have insomnia.      VITAL SIGNS:   Filed Vitals:   02/22/15 1958 02/22/15 2130 02/22/15 2328  BP: 96/52 92/49 129/63  Pulse: 112 84 95  Temp: 98.5 F (36.9 C)    TempSrc: Oral    Resp: 18 13 14   Height: 5\' 2"  (1.575 m)    Weight: 81.647 kg (180 lb)    SpO2: 99% 100% 100%   Wt Readings from Last 3 Encounters:  02/22/15 81.647 kg (180 lb)    PHYSICAL EXAMINATION:  Physical Exam  Vitals reviewed. Constitutional: She is oriented to person, place, and time. She appears well-developed and well-nourished. No distress.  HENT:  Head: Normocephalic and atraumatic.  Mouth/Throat: Oropharynx is clear and moist.  Eyes: Conjunctivae and EOM are normal. Pupils are equal, round, and reactive to light. No scleral icterus.  Neck: Normal range of motion. Neck supple. No JVD present. No thyromegaly present.  Cardiovascular: Normal rate, regular rhythm and intact distal pulses.  Exam reveals no gallop and no friction rub.   No murmur heard. Respiratory: Effort normal and breath sounds normal. No respiratory distress. She has no wheezes. She has no rales.  GI: Soft. Bowel sounds are normal. She exhibits no distension. There is no tenderness.  Musculoskeletal: Normal range of motion. She exhibits no edema.  No arthritis, no gout  Lymphadenopathy:    She has no cervical adenopathy.  Neurological: She is alert and oriented to  person, place, and time. No cranial nerve deficit.  No dysarthria, no aphasia  Skin: Skin is warm and dry. No rash noted. No erythema.  Psychiatric: She has a normal mood and affect. Her behavior is normal. Thought content normal.    LABORATORY PANEL:   CBC  Recent Labs Lab 02/22/15 2013  WBC 13.1*  HGB 11.5*  HCT 34.8*  PLT 428   ------------------------------------------------------------------------------------------------------------------  Chemistries   Recent Labs Lab 02/22/15 2013  NA 119*  K 4.4  CL 79*  CO2 21*  GLUCOSE 685*  BUN 35*  CREATININE 1.70*  CALCIUM 9.4   ------------------------------------------------------------------------------------------------------------------  Cardiac Enzymes No results for input(s): TROPONINI in the last 168 hours. ------------------------------------------------------------------------------------------------------------------  RADIOLOGY:  Dg Chest Portable 1 View  02/22/2015  CLINICAL DATA:  Weakness and tachycardia EXAM: PORTABLE CHEST 1 VIEW COMPARISON:  October 10, 2008 FINDINGS: Lungs are clear. Heart size and pulmonary vascularity are normal. No adenopathy. No bone lesions. Calcification is noted in the right carotid artery. IMPRESSION: No edema or consolidation. Calcification noted in the right carotid artery. Electronically Signed   By: Lowella Grip III M.D.   On: 02/22/2015 21:51    EKG:   Orders placed or performed during the hospital encounter of 02/22/15  . EKG 12-Lead  . EKG 12-Lead    IMPRESSION AND PLAN:  Principal Problem:   DKA (diabetic ketoacidoses) ([place]) - likely due to medication noncompliance as well as UTI. Admit to ICU with insulin drip and DKA protocol, including aggressive IV fluids, serial BMP for electrolyte monitoring. Active Problems:   UTI (lower urinary tract infection) - patient has a cephalosporin allergy, will treat with IV Levaquin. Send urine culture.   AKI (acute kidney  injury) ([place]) - likely due to polyuria leading to volume loss and dehydration. We'll monitor for improvement with aggressive fluid rehydration and DKA treatment as above   HTN (hypertension) - blood pressures normotensive at this time. We'll monitor closely. Hold home antihypertensives for now, can be restarted if her blood pressure rises.   Depression - continue home meds   HLD (hyperlipidemia) - continue home meds   GERD (gastroesophageal reflux disease) - equivalent home dose PPI  All the records are reviewed and case discussed with ED provider. Management plans discussed with the patient and/or family.  DVT PROPHYLAXIS: Subcutaneous heparin  ADMISSION STATUS: Inpatient  CODE STATUS: Full  TOTAL CRITICAL CARE TIME TAKING CARE OF THIS PATIENT: 50 minutes.    Catherine Krueger 02/22/2015, 11:54 PM  [facility]  Office  [phone]  CC: Primary care physician; [place]

## 2015-02-22 NOTE — ED Notes (Signed)
CBG 582 

## 2015-02-23 ENCOUNTER — Inpatient Hospital Stay: Payer: Medicare Other

## 2015-02-23 LAB — BASIC METABOLIC PANEL
Anion gap: 6 (ref 5–15)
Anion gap: 6 (ref 5–15)
Anion gap: 4 — ABNORMAL LOW (ref 5–15)
Anion gap: 6 (ref 5–15)
BUN: 25 mg/dL — AB (ref 6–20)
BUN: 22 mg/dL — AB (ref 6–20)
BUN: 20 mg/dL (ref 6–20)
BUN: 19 mg/dL (ref 6–20)
CHLORIDE: 100 mmol/L — AB (ref 101–111)
CHLORIDE: 102 mmol/L (ref 101–111)
CHLORIDE: 102 mmol/L (ref 101–111)
CHLORIDE: 101 mmol/L (ref 101–111)
CO2: 28 mmol/L (ref 22–32)
CO2: 26 mmol/L (ref 22–32)
CO2: 27 mmol/L (ref 22–32)
CO2: 25 mmol/L (ref 22–32)
CREATININE: 0.91 mg/dL (ref 0.44–1.00)
CREATININE: 0.87 mg/dL (ref 0.44–1.00)
CREATININE: 0.95 mg/dL (ref 0.44–1.00)
CREATININE: 0.93 mg/dL (ref 0.44–1.00)
Calcium: 8.3 mg/dL — ABNORMAL LOW (ref 8.9–10.3)
Calcium: 8.3 mg/dL — ABNORMAL LOW (ref 8.9–10.3)
Calcium: 8.5 mg/dL — ABNORMAL LOW (ref 8.9–10.3)
Calcium: 8.4 mg/dL — ABNORMAL LOW (ref 8.9–10.3)
GFR calc Af Amer: >60 mL/min (ref >60)
GFR calc Af Amer: >60 mL/min (ref >60)
GFR calc Af Amer: >60 mL/min (ref >60)
GFR calc Af Amer: >60 mL/min (ref >60)
GFR calc non Af Amer: >60 mL/min (ref >60)
GFR calc non Af Amer: >60 mL/min (ref >60)
GFR calc non Af Amer: >60 mL/min (ref >60)
GFR calc non Af Amer: >60 mL/min (ref >60)
GLUCOSE: 175 mg/dL — AB (ref 65–99)
GLUCOSE: 178 mg/dL — AB (ref 65–99)
GLUCOSE: 192 mg/dL — AB (ref 65–99)
Glucose, Bld: 168 mg/dL — ABNORMAL HIGH (ref 65–99)
POTASSIUM: 3.5 mmol/L (ref 3.5–5.1)
POTASSIUM: 3.8 mmol/L (ref 3.5–5.1)
POTASSIUM: 4.4 mmol/L (ref 3.5–5.1)
Potassium: 3.2 mmol/L — ABNORMAL LOW (ref 3.5–5.1)
SODIUM: 134 mmol/L — AB (ref 135–145)
SODIUM: 134 mmol/L — AB (ref 135–145)
SODIUM: 133 mmol/L — AB (ref 135–145)
Sodium: 132 mmol/L — ABNORMAL LOW (ref 135–145)

## 2015-02-23 LAB — CBC
HCT: 30.4 % — ABNORMAL LOW (ref 35.0–47.0)
Hemoglobin: 10.4 g/dL — ABNORMAL LOW (ref 12.0–16.0)
MCH: 29.2 pg (ref 26.0–34.0)
MCHC: 34.3 g/dL (ref 32.0–36.0)
MCV: 85.2 fL (ref 80.0–100.0)
PLATELETS: 347 10*3/uL (ref 150–440)
RBC: 3.57 MIL/uL — AB (ref 3.80–5.20)
RDW: 13 % (ref 11.5–14.5)
WBC: 9.5 10*3/uL (ref 3.6–11.0)

## 2015-02-23 LAB — GLUCOSE, CAPILLARY
GLUCOSE-CAPILLARY: 170 mg/dL — AB (ref 65–99)
GLUCOSE-CAPILLARY: 215 mg/dL — AB (ref 65–99)
GLUCOSE-CAPILLARY: 268 mg/dL — AB (ref 65–99)
GLUCOSE-CAPILLARY: 311 mg/dL — AB (ref 65–99)
GLUCOSE-CAPILLARY: 381 mg/dL — AB (ref 65–99)
Glucose-Capillary: 283 mg/dL — ABNORMAL HIGH (ref 65–99)
Glucose-Capillary: 205 mg/dL — ABNORMAL HIGH (ref 65–99)
Glucose-Capillary: 167 mg/dL — ABNORMAL HIGH (ref 65–99)
Glucose-Capillary: 205 mg/dL — ABNORMAL HIGH (ref 65–99)
Glucose-Capillary: 172 mg/dL — ABNORMAL HIGH (ref 65–99)
Glucose-Capillary: 182 mg/dL — ABNORMAL HIGH (ref 65–99)
Glucose-Capillary: 114 mg/dL — ABNORMAL HIGH (ref 65–99)
Glucose-Capillary: 282 mg/dL — ABNORMAL HIGH (ref 65–99)

## 2015-02-23 LAB — HEMOGLOBIN A1C: HEMOGLOBIN A1C: 14.5 % — AB (ref 4.0–6.0)

## 2015-02-23 LAB — MRSA PCR SCREENING: MRSA BY PCR: NEGATIVE

## 2015-02-23 MED ORDER — PANTOPRAZOLE SODIUM 40 MG PO TBEC
40.0000 mg | DELAYED_RELEASE_TABLET | Freq: Every day | ORAL | Status: DC
  Administered 2015-02-23 – 2015-02-24 (×2): 40 mg via ORAL
  Filled 2015-02-23 (×2): qty 1

## 2015-02-23 MED ORDER — SODIUM CHLORIDE 0.9 % IV SOLN: INTRAVENOUS | Status: DC

## 2015-02-23 MED ORDER — DEXTROSE-NACL 5-0.45 % IV SOLN
INTRAVENOUS | Status: DC
  Administered 2015-02-23 (×2): via INTRAVENOUS

## 2015-02-23 MED ORDER — HEPARIN SODIUM (PORCINE) 5000 UNIT/ML IJ SOLN
5000.0000 [IU] | Freq: Three times a day (TID) | INTRAMUSCULAR | Status: DC
  Administered 2015-02-23 – 2015-02-24 (×5): 5000 [IU] via SUBCUTANEOUS
  Filled 2015-02-23 (×5): qty 1

## 2015-02-23 MED ORDER — NICOTINE 10 MG IN INHA
1.0000 | RESPIRATORY_TRACT | Status: DC | PRN
  Filled 2015-02-23: qty 36

## 2015-02-23 MED ORDER — NICOTINE 21 MG/24HR TD PT24
21.0000 mg | MEDICATED_PATCH | Freq: Every day | TRANSDERMAL | Status: DC
  Administered 2015-02-23: 21 mg via TRANSDERMAL
  Filled 2015-02-23 (×2): qty 1

## 2015-02-23 MED ORDER — POTASSIUM CHLORIDE CRYS ER 20 MEQ PO TBCR
40.0000 meq | EXTENDED_RELEASE_TABLET | Freq: Two times a day (BID) | ORAL | Status: DC
  Administered 2015-02-23 – 2015-02-24 (×3): 40 meq via ORAL
  Filled 2015-02-23 (×3): qty 2

## 2015-02-23 MED ORDER — POTASSIUM CHLORIDE 10 MEQ/100ML IV SOLN
10.0000 meq | INTRAVENOUS | Status: AC
  Administered 2015-02-23 (×2): 10 meq via INTRAVENOUS
  Filled 2015-02-23 (×2): qty 100

## 2015-02-23 MED ORDER — ATORVASTATIN CALCIUM 20 MG PO TABS
40.0000 mg | ORAL_TABLET | Freq: Every day | ORAL | Status: DC
  Administered 2015-02-23: 40 mg via ORAL
  Filled 2015-02-23: qty 2
  Filled 2015-02-23: qty 1

## 2015-02-23 MED ORDER — INSULIN GLARGINE 100 UNIT/ML ~~LOC~~ SOLN
80.0000 [IU] | Freq: Every day | SUBCUTANEOUS | Status: DC
  Administered 2015-02-23 – 2015-02-24 (×2): 80 [IU] via SUBCUTANEOUS
  Filled 2015-02-23 (×3): qty 0.8

## 2015-02-23 MED ORDER — INSULIN ASPART 100 UNIT/ML ~~LOC~~ SOLN
0.0000 [IU] | SUBCUTANEOUS | Status: DC
  Administered 2015-02-23: 8 [IU] via SUBCUTANEOUS
  Administered 2015-02-23 (×2): 12 [IU] via SUBCUTANEOUS
  Administered 2015-02-24: 03:00:00 4 [IU] via SUBCUTANEOUS
  Administered 2015-02-24: 05:00:00 12 [IU] via SUBCUTANEOUS
  Administered 2015-02-24: 4 [IU] via SUBCUTANEOUS
  Administered 2015-02-24: 8 [IU] via SUBCUTANEOUS
  Administered 2015-02-24: 09:00:00 2 [IU] via SUBCUTANEOUS
  Filled 2015-02-23: qty 8
  Filled 2015-02-23: qty 12
  Filled 2015-02-23: qty 4
  Filled 2015-02-23: qty 12
  Filled 2015-02-23: qty 4
  Filled 2015-02-23: qty 2
  Filled 2015-02-23: qty 8
  Filled 2015-02-23: qty 13

## 2015-02-23 MED ORDER — POTASSIUM CHLORIDE IN NACL 20-0.9 MEQ/L-% IV SOLN
INTRAVENOUS | Status: DC
  Administered 2015-02-23: 09:00:00 via INTRAVENOUS
  Filled 2015-02-23 (×3): qty 1000

## 2015-02-23 MED ORDER — VENLAFAXINE HCL ER 37.5 MG PO CP24
150.0000 mg | ORAL_CAPSULE | Freq: Every day | ORAL | Status: DC
  Administered 2015-02-23 – 2015-02-24 (×2): 150 mg via ORAL
  Filled 2015-02-23: qty 4
  Filled 2015-02-23: qty 2

## 2015-02-23 MED ORDER — SODIUM CHLORIDE 0.9 % IV SOLN
INTRAVENOUS | Status: DC
  Administered 2015-02-23: 04:00:00 via INTRAVENOUS

## 2015-02-23 MED ORDER — INFLUENZA VAC SPLIT QUAD 0.5 ML IM SUSY
0.5000 mL | PREFILLED_SYRINGE | INTRAMUSCULAR | Status: AC
  Administered 2015-02-24: 09:00:00 0.5 mL via INTRAMUSCULAR
  Filled 2015-02-23: qty 0.5

## 2015-02-23 MED ORDER — DIPHENHYDRAMINE HCL 25 MG PO CAPS
25.0000 mg | ORAL_CAPSULE | Freq: Four times a day (QID) | ORAL | Status: DC | PRN
  Administered 2015-02-23: 15:00:00 25 mg via ORAL
  Filled 2015-02-23: qty 1

## 2015-02-23 MED ORDER — ACETAMINOPHEN 325 MG PO TABS: 650.0000 mg | ORAL_TABLET | Freq: Four times a day (QID) | ORAL | Status: DC | PRN

## 2015-02-23 MED ORDER — NICOTINE POLACRILEX 2 MG MT GUM: 2.0000 mg | CHEWING_GUM | OROMUCOSAL | Status: DC | PRN

## 2015-02-23 MED ORDER — ONDANSETRON HCL 4 MG/2ML IJ SOLN: 4.0000 mg | Freq: Four times a day (QID) | INTRAMUSCULAR | Status: DC | PRN

## 2015-02-23 NOTE — Evaluation (Signed)
Physical Therapy Evaluation Patient Details Name: Catherine Krueger MRN: [id number] DOB: 05-Jan-1966 Today's Date: 02/23/2015   History of Present Illness  Pt here with DKA, blood sugars were >600 on ED  Clinical Impression  Pt is able to ambulate w/o AD and shows good confidence and safety.  She is able to negotiate up/down stairs and has very little fatigue with the effort (O2 99% after ambulation). She shows good effort, has functional strength and does not need further PT intervention.     Follow Up Recommendations No PT follow up    Equipment Recommendations       Recommendations for Other Services       Precautions / Restrictions Restrictions Weight Bearing Restrictions: No      Mobility  Bed Mobility Overal bed mobility: Independent             General bed mobility comments: Pt with no issues getting to EOB from supine  Transfers Overall transfer level: Independent               General transfer comment: rises and maintains balance w/o AD   Ambulation/Gait Ambulation/Gait assistance: Independent Ambulation Distance (Feet): 250 Feet Assistive device: None       General Gait Details: Pt walks with good confidence, consistent speed and has no LOBs  Stairs Stairs: Yes Stairs assistance: Independent Stair Management: One rail Right Number of Stairs: 4    Wheelchair Mobility    Modified Rankin (Stroke Patients Only)       Balance                                             Pertinent Vitals/Pain Pain Assessment: No/denies pain    Home Living Family/patient expects to be discharged to:: Private residence Living Arrangements: Children Available Help at Discharge: Family Type of Home: House Home Access: Stairs to enter Entrance Stairs-Rails: Can reach both Entrance Stairs-Number of Steps: 3          Prior Function Level of Independence: Independent         Comments: Pt getting out of the house ~1x/wk and  generally is able to do all she needs     Hand Dominance        Extremity/Trunk Assessment   Upper Extremity Assessment: Overall WFL for tasks assessed           Lower Extremity Assessment: Overall WFL for tasks assessed         Communication   Communication: No difficulties  Cognition Arousal/Alertness: Awake/alert Behavior During Therapy: WFL for tasks assessed/performed Overall Cognitive Status: Within Functional Limits for tasks assessed                      General Comments      Exercises        Assessment/Plan    PT Assessment Patent does not need any further PT services  PT Diagnosis Generalized weakness;Difficulty walking   PT Problem List    PT Treatment Interventions     PT Goals (Current goals can be found in the Care Plan section) Acute Rehab PT Goals Patient Stated Goal: "Go back home"    Frequency     Barriers to discharge        Co-evaluation               End of Session Equipment Utilized  During Treatment: Gait belt Activity Tolerance: Patient tolerated treatment well Patient left: in bed           Time: 1737-1757 PT Time Calculation (min) (ACUTE ONLY): 20 min   Charges:   PT Evaluation $Initial PT Evaluation Tier I: 1 Procedure     PT G Codes:       Wayne Both, PT, DPT [phone]  Kreg Shropshire 02/23/2015, 6:17 PM

## 2015-02-23 NOTE — Progress Notes (Signed)
  Clinical Education officer, museum (CSW) received consult.  Patient has no CSW needs at this time. RN Case Manager is following for any possible home health needs. Please reconsult if future social work needs arise.   CSW signing off Casimer Lanius. Latanya Presser, MSW Clinical Social Work Department [phone] 2:14 PM

## 2015-02-23 NOTE — Care Management Note (Signed)
Case Management Note  Patient Details  Name: Catherine Krueger MRN: [id number] Date of Birth: [birth year]  Subjective/Objective:      Possible discharge on Monday per Dr Ether Griffins.               Action/Plan:   Expected Discharge Date:                  Expected Discharge Plan:     In-House Referral:     Discharge planning Services     Post Acute Care Choice:    Choice offered to:     DME Arranged:    DME Agency:     HH Arranged:    [place] Agency:     Status of Service:     Medicare Important Message Given:    Date Medicare IM Given:    Medicare IM give by:    Date Additional Medicare IM Given:    Additional Medicare Important Message give by:     If discussed at [place] of Stay Meetings, dates discussed:    Additional Comments:  Yuya Vanwingerden A, RN 02/23/2015, 12:18 PM

## 2015-02-23 NOTE — Progress Notes (Signed)
eLink Physician-Brief Progress Note Patient Name: Catherine Krueger DOB: [birth year] MRN: [id number]   Date of Service  02/23/2015  HPI/Events of Note  65 F with DM/HTN presenting with DKA in the setting of UTI.  Currently patient is HD stable in NAD on insulin and levofloxacin.  eICU Interventions  Plan of care per primary team Continue to monitor via [facility]     Intervention Category Evaluation Type: New Patient Evaluation  DETERDING,ELIZABETH 02/23/2015, 3:26 AM

## 2015-02-23 NOTE — Progress Notes (Signed)
Pt transferred from ccu, alert and oriented. Scores moderate on fall risk assessment, pt is independent. BS checked q4. Patient calls out for assistance if needed

## 2015-02-23 NOTE — Progress Notes (Signed)
Pt alert and oriented, admitted for DKA blood sugars < then 300 today, pt non compliant with diet and asks for a lot of in between snacks. Up to the bathroom for frequent urination, other vital signs stable. Remains on a q4 hour blood sugar check. Pt worked with pt, no outside therapy needed. Possible discharge Monday.

## 2015-02-23 NOTE — ED Notes (Signed)
Per Maia Breslow in lab urine culture added.

## 2015-02-23 NOTE — Progress Notes (Signed)
[facility] [place] NAME: Catherine Krueger    MR#:  [id number]  DATE OF BIRTH:  16-Jun-1965  SUBJECTIVE:  CHIEF COMPLAINT:   Chief Complaint  Patient presents with  . Hyperglycemia   patient is 49 year old Caucasian female with past medical history significant for history of DKA who presents to the hospital with altered mental status, somnolence and frequent urination. In emergency room, she was found to be in DKA with blood glucose levels of 600 and anion gap of 19. A urinalysis was remarkable for pyuria, she admitted of dysuria and subjective fevers, her labs revealed acute renal injury with creatinine level of 1.7. She was admitted for DKA therapy to intensive care unit for IV insulin drip, IV fluid administration. With this, her condition improved, as well as her blood glucose levels. Acidosis resolved. She feels satisfactory today, although cannot tell me if she had any pains in the back, flanks, abdomen at home.   Review of Systems  Unable to perform ROS: mental acuity    VITAL SIGNS: Blood pressure 115/65, pulse 88, temperature 99.2 F (37.3 C), temperature source Oral, resp. rate 19, height 5\' 2"  (1.575 m), weight 82.5 kg (181 lb 14.1 oz), SpO2 99 %.  PHYSICAL EXAMINATION:   GENERAL:  49 y.o.-year-old patient sitting in the bed with no acute distress. Somewhat loopy and confused. Not in discomfort,  sitting in the bed EYES: Pupils equal, round, reactive to light and accommodation. No scleral icterus. Extraocular muscles intact.  HEENT: Head atraumatic, normocephalic. Oropharynx and nasopharynx clear.  NECK:  Supple, no jugular venous distention. No thyroid enlargement, no tenderness.  LUNGS: Normal breath sounds bilaterally, no wheezing, rales,rhonchi or crepitation. No use of accessory muscles of respiration.  CARDIOVASCULAR: S1, S2 normal. No murmurs, rubs, or gallops.  ABDOMEN: Soft, nontender, nondistended. Bowel sounds  present. No organomegaly or mass. Bilateral CVA percussion did not elicit any pain  EXTREMITIES: No pedal edema, cyanosis, or clubbing.  NEUROLOGIC: Cranial nerves II through XII are intact. Muscle strength 5/5 in all extremities. Sensation intact. Gait not checked.  PSYCHIATRIC: The patient is alert and oriented x 3, but slow to respond, confused about questions.  SKIN: No obvious rash, lesion, or ulcer.   ORDERS/RESULTS REVIEWED:   CBC  Recent Labs Lab 02/22/15 2013 02/23/15 0450  WBC 13.1* 9.5  HGB 11.5* 10.4*  HCT 34.8* 30.4*  PLT 428 347  MCV 88.4 85.2  MCH 29.1 29.2  MCHC 32.9 34.3  RDW 13.4 13.0   ------------------------------------------------------------------------------------------------------------------  Chemistries   Recent Labs Lab 02/22/15 2013 02/23/15 0450 02/23/15 0857  NA 119* 134* 134*  K 4.4 3.2* 3.5  CL 79* 100* 102  CO2 21* 28 26  GLUCOSE 685* 175* 168*  BUN 35* 25* 22*  CREATININE 1.70* 0.91 0.87  CALCIUM 9.4 8.3* 8.3*   ------------------------------------------------------------------------------------------------------------------ estimated creatinine clearance is 77.9 mL/min (by C-G formula based on Cr of 0.87). ------------------------------------------------------------------------------------------------------------------ No results for input(s): TSH, T4TOTAL, T3FREE, THYROIDAB in the last 72 hours.  Invalid input(s): FREET3  Cardiac Enzymes No results for input(s): CKMB, TROPONINI, MYOGLOBIN in the last 168 hours.  Invalid input(s): CK ------------------------------------------------------------------------------------------------------------------ Invalid input(s): POCBNP ---------------------------------------------------------------------------------------------------------------  RADIOLOGY: Ct Head Wo Contrast  02/23/2015  CLINICAL DATA:  Confusion. 49 y.o. female who presents with hyperglycemia. She reports that she has  felt weak with low energy for the past week. She reports that many things have been going on in her life EXAM: CT HEAD WITHOUT CONTRAST TECHNIQUE:  Contiguous axial images were obtained from the base of the skull through the vertex without intravenous contrast. COMPARISON:  None. FINDINGS: There is no evidence of acute intracranial hemorrhage, brain edema, mass lesion, acute infarction, mass effect, or midline shift. Acute infarct may be inapparent on noncontrast CT. No other intra-axial abnormalities are seen, and the ventricles and sulci are within normal limits in size and symmetry. No abnormal extra-axial fluid collections or masses are identified. No significant calvarial abnormality. IMPRESSION: 1. Negative for bleed or other acute intracranial process. Electronically Signed   By: Lucrezia Europe M.D.   On: 02/23/2015 11:47   Dg Chest Portable 1 View  02/22/2015  CLINICAL DATA:  Weakness and tachycardia EXAM: PORTABLE CHEST 1 VIEW COMPARISON:  October 10, 2008 FINDINGS: Lungs are clear. Heart size and pulmonary vascularity are normal. No adenopathy. No bone lesions. Calcification is noted in the right carotid artery. IMPRESSION: No edema or consolidation. Calcification noted in the right carotid artery. Electronically Signed   By: Lowella Grip III M.D.   On: 02/22/2015 21:51    EKG:  Orders placed or performed during the hospital encounter of 02/22/15  . EKG 12-Lead  . EKG 12-Lead    ASSESSMENT AND PLAN:  Principal Problem:   DKA (diabetic ketoacidoses) (HCC) Active Problems:   Depression   HLD (hyperlipidemia)   HTN (hypertension)   GERD (gastroesophageal reflux disease)   UTI (lower urinary tract infection)   AKI (acute kidney injury) ([place]) 1. DKA, resolved, patient's hemoglobin A1c was 6.8, which signifies good diabetes control overall. Suspect infection as the cause of DKA, change patient insulin to insulin Lantus as well as sliding scale and discontinue insulin IV drip, continue IV  fluids 2. Hyponatremia, likely dehydration, continue IV fluids and follow sodium level in the morning 3. Hypokalemia, resolved 4. Urinary tract infection, likely acute cystitis without hematuria, continue patient on levofloxacin following culture results 5. Metabolic encephalopathy, CT of head was unremarkable, follow clinically   Management plans discussed with the patient, family and they are in agreement.   DRUG ALLERGIES:  Allergies  Allergen Reactions  . Diclofenac Sodium Other (See Comments)  . Keflex [Cephalexin] Palpitations    CODE STATUS:     Code Status Orders        Start     Ordered   02/23/15 0320  Full code   Continuous     02/23/15 0319      TOTAL TIME TAKING CARE OF THIS PATIENT: 35 minutes.    Theodoro Grist M.D on 02/23/2015 at 12:13 PM  Between 7am to 6pm - Pager - [phone]  After 6pm go to [email or web address] - password [email or web address] [facility]  [facility]  Office  [phone]  CC: Primary care physician; [place]

## 2015-02-24 DIAGNOSIS — E860 Dehydration: Secondary | ICD-10-CM

## 2015-02-24 DIAGNOSIS — E669 Obesity, unspecified: Secondary | ICD-10-CM

## 2015-02-24 DIAGNOSIS — D72829 Elevated white blood cell count, unspecified: Secondary | ICD-10-CM

## 2015-02-24 DIAGNOSIS — E871 Hypo-osmolality and hyponatremia: Secondary | ICD-10-CM

## 2015-02-24 DIAGNOSIS — E876 Hypokalemia: Secondary | ICD-10-CM

## 2015-02-24 DIAGNOSIS — D649 Anemia, unspecified: Secondary | ICD-10-CM

## 2015-02-24 DIAGNOSIS — E1310 Other specified diabetes mellitus with ketoacidosis without coma: Secondary | ICD-10-CM | POA: Diagnosis not present

## 2015-02-24 DIAGNOSIS — Z720 Tobacco use: Secondary | ICD-10-CM

## 2015-02-24 LAB — GLUCOSE, CAPILLARY
Glucose-Capillary: 191 mg/dL — ABNORMAL HIGH (ref 65–99)
Glucose-Capillary: 259 mg/dL — ABNORMAL HIGH (ref 65–99)
Glucose-Capillary: 156 mg/dL — ABNORMAL HIGH (ref 65–99)
Glucose-Capillary: 244 mg/dL — ABNORMAL HIGH (ref 65–99)
Glucose-Capillary: 176 mg/dL — ABNORMAL HIGH (ref 65–99)

## 2015-02-24 LAB — BASIC METABOLIC PANEL
Anion gap: 5 (ref 5–15)
BUN: 12 mg/dL (ref 6–20)
CO2: 26 mmol/L (ref 22–32)
Calcium: 8.6 mg/dL — ABNORMAL LOW (ref 8.9–10.3)
Chloride: 107 mmol/L (ref 101–111)
Creatinine, Ser: 0.73 mg/dL (ref 0.44–1.00)
GFR calc Af Amer: >60 mL/min (ref >60)
GLUCOSE: 177 mg/dL — AB (ref 65–99)
Potassium: 3.8 mmol/L (ref 3.5–5.1)
Sodium: 138 mmol/L (ref 135–145)

## 2015-02-24 MED ORDER — LEVOFLOXACIN 750 MG PO TABS: 750.0000 mg | ORAL_TABLET | Freq: Every day | ORAL | Status: DC

## 2015-02-24 MED ORDER — LIVING WELL WITH DIABETES BOOK
Freq: Once | Status: AC
  Administered 2015-02-24: 18:00:00
  Filled 2015-02-24: qty 1

## 2015-02-24 MED ORDER — NICOTINE POLACRILEX 2 MG MT GUM: 2.0000 mg | CHEWING_GUM | OROMUCOSAL | Status: DC | PRN

## 2015-02-24 MED ORDER — INSULIN GLARGINE 100 UNIT/ML ~~LOC~~ SOLN: 100.0000 [IU] | Freq: Every day | SUBCUTANEOUS | Status: DC

## 2015-02-24 MED ORDER — INSULIN ASPART 100 UNIT/ML ~~LOC~~ SOLN: 0.0000 [IU] | SUBCUTANEOUS | Status: DC

## 2015-02-24 MED ORDER — NICOTINE 21 MG/24HR TD PT24: 21.0000 mg | MEDICATED_PATCH | Freq: Every day | TRANSDERMAL | Status: DC

## 2015-02-24 NOTE — Plan of Care (Signed)
Problem: Food- and Nutrition-Related Knowledge Deficit (NB-1.1) Goal: Nutrition education Formal process to instruct or train a patient/client in a skill or to impart knowledge to help patients/clients voluntarily manage or modify food choices and eating behavior to maintain or improve health.  Outcome: Completed/Met Date Met:  02/24/15    RD consulted for nutrition education regarding diabetes.     Lab Results  Component Value Date    HGBA1C 14.5* 02/23/2015    Education: RD provided "Carbohydrate Counting for People with Diabetes" handout from the Academy of Nutrition and Dietetics. Discussed different food groups and their effects on blood sugar, emphasizing carbohydrate-containing foods. Provided list of carbohydrates and recommended serving sizes of common foods.  Discussed importance of controlled and consistent carbohydrate intake throughout the day. Provided examples of ways to balance meals/snacks and encouraged intake of high-fiber, whole grain complex carbohydrates. RD stressed "Plate Method" for portion control identifying foods with carbohydrates. Diabetes coordinator reports order for "Living Well with Diabetes Booklet" ordered for additional education as well as referral for Lifestyle center follow-up. Teach back method used.  Expect good compliance.  Body mass index is 33.26 kg/(m^2).    Food and Knowledge related food deficit related to DM as evidenced by pt with HgbA1c of 14.5% and consult for diet education   Current diet order is Carb Modified, patient is consuming approximately 100% of meals at this time. Labs and medications reviewed. No further nutrition interventions warranted at this time, and pt with active order for discharge. RD contact information provided. If additional nutrition issues arise, please re-consult RD.   Dwyane Luo, [place], LDN Pager [phone]

## 2015-02-24 NOTE — Plan of Care (Signed)
Problem: Discharge Progression Outcomes Goal: Other Discharge Outcomes/Goals Outcome: Progressing Patient blood sugar in better control, planning to discharge home today, [place] and Diabetes coordinator visited today and reviewed education on diabetes management.

## 2015-02-24 NOTE — Discharge Summary (Addendum)
[facility] [place] NAME: Catherine Krueger    MR#:  [id number]  DATE OF BIRTH:  [birth year]  DATE OF ADMISSION:  02/22/2015 ADMITTING PHYSICIAN: Lance Coon, MD  DATE OF DISCHARGE: No discharge date for patient encounter.  PRIMARY CARE PHYSICIAN: [facility]     ADMISSION DIAGNOSIS:  Diabetic ketoacidosis without coma associated with type 2 diabetes mellitus (HCC) [E13.10]  DISCHARGE DIAGNOSIS:  Principal Problem:   DKA (diabetic ketoacidoses) ([place]) Active Problems:   UTI (lower urinary tract infection)   AKI (acute kidney injury) ([place])   Dehydration   Depression   HLD (hyperlipidemia)   HTN (hypertension)   GERD (gastroesophageal reflux disease)   Hypokalemia   Hyponatremia   Leukocytosis   Anemia   Obesity   Tobacco abuse   SECONDARY DIAGNOSIS:   Past Medical History  Diagnosis Date  . Depression   . Diabetes mellitus, type II ([place])   . Hypertension   . HLD (hyperlipidemia)   . Cervical cancer (HCC)     remission, dx at age 40  . H. pylori infection 2013  . GERD (gastroesophageal reflux disease)     .pro HOSPITAL COURSE:  The patient is 49 year old Caucasian female with past medical history significant for history of diabetes who presents to the hospital with altered mental status, somnolence and frequent urination. In emergency room, she was found to be in DKA with blood glucose levels of 600 and anion gap of 19. A urinalysis was remarkable for pyuria, she admitted of dysuria and subjective fevers, her labs revealed acute renal injury with creatinine level of 1.7. She was admitted for DKA therapy to intensive care unit for IV insulin drip, IV fluid administration. With this, her condition improved, as well as her blood glucose levels. Acidosis resolved. Her hemoglobin A1c was checked, was found to be 14.5, signifying poor diabetes control. Overall. Patient was seen by diabetic education nurse as well  as dietitian and recommended outpatient diabetic education. Discussion by problem 1. DKA, resolved, patient's hemoglobin A1c was 14.5, which signifies poor diabetes control overall. Advance insulin Lantus, follow-up with outpatient diabetic education nurse, dietary was consulted and advised about diabetic diet 2. Hyponatremia, likely dehydration, resolved with IV fluids  3. Hypokalemia, resolved 4. acute cystitis without hematuria due to gram-negative rods, ID to follow, continue patient on levofloxacin to complete course  5. Metabolic encephalopathy, CT of head was unremarkable, resolved  6. Tobacco abuse, she was counseled for approximately 4 minutes and nicotine replacement therapy was recommended as outpatient DISCHARGE CONDITIONS:   Stable  CONSULTS OBTAINED:     DRUG ALLERGIES:   Allergies  Allergen Reactions  . Diclofenac Sodium Other (See Comments)  . Keflex [Cephalexin] Palpitations    DISCHARGE MEDICATIONS:   Current Discharge Medication List    START taking these medications   Details  insulin aspart (NOVOLOG) 100 UNIT/ML injection Inject 0-24 Units into the skin every 4 (four) hours. Qty: 10 mL, Refills: 11    insulin glargine (LANTUS) 100 UNIT/ML injection Inject 1 mL (100 Units total) into the skin daily. Qty: 10 mL, Refills: 11    levofloxacin (LEVAQUIN) 750 MG tablet Take 1 tablet (750 mg total) by mouth daily. Qty: 3 tablet, Refills: 0    nicotine (NICODERM CQ - DOSED IN MG/24 HOURS) 21 mg/24hr patch Place 1 patch (21 mg total) onto the skin daily. Qty: 28 patch, Refills: 0    nicotine polacrilex (NICORETTE) 2 MG gum Take 1 each (  2 mg total) by mouth as needed for smoking cessation. Qty: 100 tablet, Refills: 0      CONTINUE these medications which have NOT CHANGED   Details  acetaminophen (TYLENOL) 500 MG tablet Take 500 mg by mouth every 6 (six) hours as needed.    amitriptyline (ELAVIL) 25 MG tablet Take 25 mg by mouth at bedtime.    atorvastatin  (LIPITOR) 40 MG tablet Take 40 mg by mouth daily.    ESOMEPRAZOLE MAGNESIUM PO Take 1 tablet by mouth daily.    fluticasone (FLONASE) 50 MCG/ACT nasal spray Place 2 sprays into both nostrils daily.    lisinopril (PRINIVIL,ZESTRIL) 40 MG tablet Take 40 mg by mouth daily.    metFORMIN (GLUCOPHAGE) 1000 MG tablet Take 1,000 mg by mouth 2 (two) times daily with a meal.    RELION PEN NEEDLES 29G X 12MM MISC     sucralfate (CARAFATE) 1 G tablet Take 1 g by mouth 2 (two) times daily.    venlafaxine XR (EFFEXOR-XR) 150 MG 24 hr capsule Take 150 mg by mouth daily with breakfast.      STOP taking these medications     hydrochlorothiazide (MICROZIDE) 12.5 MG capsule      Insulin Glargine (LANTUS) 100 UNIT/ML Solostar Pen          DISCHARGE INSTRUCTIONS:    Patient is to follow-up with primary care physician as well as lifestyle Center as outpatient  If you experience worsening of your admission symptoms, develop shortness of breath, life threatening emergency, suicidal or homicidal thoughts you must seek medical attention immediately by calling 911 or calling your MD immediately  if symptoms less severe.  You Must read complete instructions/literature along with all the possible adverse reactions/side effects for all the Medicines you take and that have been prescribed to you. Take any new Medicines after you have completely understood and accept all the possible adverse reactions/side effects.   Please note  You were cared for by a hospitalist during your hospital stay. If you have any questions about your discharge medications or the care you received while you were in the hospital after you are discharged, you can call the unit and asked to speak with the hospitalist on call if the hospitalist that took care of you is not available. Once you are discharged, your primary care physician will handle any further medical issues. Please note that NO REFILLS for any discharge medications will  be authorized once you are discharged, as it is imperative that you return to your primary care physician (or establish a relationship with a primary care physician if you do not have one) for your aftercare needs so that they can reassess your need for medications and monitor your lab values.    Today   CHIEF COMPLAINT:   Chief Complaint  Patient presents with  . Hyperglycemia    HISTORY OF PRESENT ILLNESS:  Catherine Krueger  is a 49 y.o. female with a known history of diabetes who presents to the hospital with altered mental status, somnolence and frequent urination. In emergency room, she was found to be in DKA with blood glucose levels of 600 and anion gap of 19. A urinalysis was remarkable for pyuria, she admitted of dysuria and subjective fevers, her labs revealed acute renal injury with creatinine level of 1.7. She was admitted for DKA therapy to intensive care unit for IV insulin drip, IV fluid administration. With this, her condition improved, as well as her blood glucose levels. Acidosis resolved. Her  hemoglobin A1c was checked, was found to be 14.5, signifying poor diabetes control. Overall. Patient was seen by diabetic education nurse as well as dietitian and recommended outpatient diabetic education. Discussion by problem 1. DKA, resolved, patient's hemoglobin A1c was 14.5, which signifies poor diabetes control overall. Advance insulin Lantus, follow-up with outpatient diabetic education nurse, dietary was consulted and advised about diabetic diet 2. Hyponatremia, likely dehydration, resolved with IV fluids  3. Hypokalemia, resolved 4. acute cystitis without hematuria due to gram-negative rods, ID to follow, continue patient on levofloxacin to complete course  5. Metabolic encephalopathy, CT of head was unremarkable, resolved  6. Tobacco abuse, she was counseled for approximately 4 minutes and nicotine replacement therapy was recommended as outpatient   VITAL SIGNS:  Blood  pressure 114/57, pulse 70, temperature 98.8 F (37.1 C), temperature source Oral, resp. rate 16, height 5\' 2"  (1.575 m), weight 82.5 kg (181 lb 14.1 oz), SpO2 99 %.  I/O:   Intake/Output Summary (Last 24 hours) at 02/24/15 1547 Last data filed at 02/24/15 1300  Gross per 24 hour  Intake   3065 ml  Output      0 ml  Net   3065 ml    PHYSICAL EXAMINATION:  GENERAL:  49 y.o.-year-old patient lying in the bed with no acute distress.  EYES: Pupils equal, round, reactive to light and accommodation. No scleral icterus. Extraocular muscles intact.  HEENT: Head atraumatic, normocephalic. Oropharynx and nasopharynx clear.  NECK:  Supple, no jugular venous distention. No thyroid enlargement, no tenderness.  LUNGS: Normal breath sounds bilaterally, no wheezing, rales,rhonchi or crepitation. No use of accessory muscles of respiration.  CARDIOVASCULAR: S1, S2 normal. No murmurs, rubs, or gallops.  ABDOMEN: Soft, non-tender, non-distended. Bowel sounds present. No organomegaly or mass.  EXTREMITIES: No pedal edema, cyanosis, or clubbing.  NEUROLOGIC: Cranial nerves II through XII are intact. Muscle strength 5/5 in all extremities. Sensation intact. Gait not checked.  PSYCHIATRIC: The patient is alert and oriented x 3.  SKIN: No obvious rash, lesion, or ulcer.   DATA REVIEW:   CBC  Recent Labs Lab 02/23/15 0450  WBC 9.5  HGB 10.4*  HCT 30.4*  PLT 347    Chemistries   Recent Labs Lab 02/24/15 0426  NA 138  K 3.8  CL 107  CO2 26  GLUCOSE 177*  BUN 12  CREATININE 0.73  CALCIUM 8.6*    Cardiac Enzymes No results for input(s): TROPONINI in the last 168 hours.  Microbiology Results  Results for orders placed or performed during the hospital encounter of 02/22/15  MRSA PCR Screening     Status: None   Collection Time: 02/22/15  7:49 PM  Result Value Ref Range Status   MRSA by PCR NEGATIVE NEGATIVE Final    Comment:        The GeneXpert MRSA Assay (FDA approved for NASAL  specimens only), is one component of a comprehensive MRSA colonization surveillance program. It is not intended to diagnose MRSA infection nor to guide or monitor treatment for MRSA infections.   Urine culture     Status: None (Preliminary result)   Collection Time: 02/22/15 10:17 PM  Result Value Ref Range Status   Specimen Description URINE, RANDOM  Final   Special Requests NONE  Final   Culture   Final    >=100,000 COLONIES/mL GRAM NEGATIVE RODS IDENTIFICATION AND SUSCEPTIBILITIES TO FOLLOW    Report Status PENDING  Incomplete    RADIOLOGY:  Ct Head Wo Contrast  02/23/2015  CLINICAL  DATA:  Confusion. 49 y.o. female who presents with hyperglycemia. She reports that she has felt weak with low energy for the past week. She reports that many things have been going on in her life EXAM: CT HEAD WITHOUT CONTRAST TECHNIQUE: Contiguous axial images were obtained from the base of the skull through the vertex without intravenous contrast. COMPARISON:  None. FINDINGS: There is no evidence of acute intracranial hemorrhage, brain edema, mass lesion, acute infarction, mass effect, or midline shift. Acute infarct may be inapparent on noncontrast CT. No other intra-axial abnormalities are seen, and the ventricles and sulci are within normal limits in size and symmetry. No abnormal extra-axial fluid collections or masses are identified. No significant calvarial abnormality. IMPRESSION: 1. Negative for bleed or other acute intracranial process. Electronically Signed   By: Lucrezia Europe M.D.   On: 02/23/2015 11:47   Dg Chest Portable 1 View  02/22/2015  CLINICAL DATA:  Weakness and tachycardia EXAM: PORTABLE CHEST 1 VIEW COMPARISON:  October 10, 2008 FINDINGS: Lungs are clear. Heart size and pulmonary vascularity are normal. No adenopathy. No bone lesions. Calcification is noted in the right carotid artery. IMPRESSION: No edema or consolidation. Calcification noted in the right carotid artery. Electronically  Signed   By: Lowella Grip III M.D.   On: 02/22/2015 21:51    EKG:   Orders placed or performed during the hospital encounter of 02/22/15  . EKG 12-Lead  . EKG 12-Lead      Management plans discussed with the patient, family and they are in agreement.  CODE STATUS:     Code Status Orders        Start     Ordered   02/23/15 0320  Full code   Continuous     02/23/15 0319      TOTAL TIME TAKING CARE OF THIS PATIENT: 40 minutes.    Theodoro Grist M.D on 02/24/2015 at 3:47 PM  Between 7am to 6pm - Pager - [phone]  After 6pm go to [email or web address] - password [email or web address]  [facility]  Office  [phone]  CC: Primary care physician; [place]

## 2015-02-24 NOTE — Progress Notes (Signed)
Inpatient Diabetes Program Recommendations  AACE/ADA: New Consensus Statement on Inpatient Glycemic Control (2015)  Target Ranges:  Prepandial:   less than 140 mg/dL      Peak postprandial:   less than 180 mg/dL (1-2 hours)      Critically ill patients:  140 - 180 mg/dL   Review of Glycemic Control:  Results for Catherine Krueger, Catherine Krueger (MRN [id number]) as of 02/24/2015 10:58  Ref. Range 02/23/2015 16:53 02/23/2015 21:08 02/24/2015 01:47 02/24/2015 03:56 02/24/2015 08:46  Glucose-Capillary Latest Ref Range: 65-99 mg/dL 268 (H) 282 (H) 191 (H) 259 (H) 156 (H)  Results for Catherine Krueger, Catherine Krueger (MRN [id number]) as of 02/24/2015 10:58  Ref. Range 02/23/2015 08:57  Hemoglobin A1C Latest Ref Range: 4.0-6.0 % 14.5 (H)   Diabetes history: Type 2 diabetes Outpatient Diabetes medications: Lantus 80 units bid, Metformin 1000 mg bid Current orders for Inpatient glycemic control:  Lantus 80 units daily, TCTS q 4 hours  Inpatient Diabetes Program Recommendations:     Spoke with patient at length regarding her diabetes and elevated A1C.  She admits that she rarely checks her blood sugars b/c she often loses her meter. Discussed A1C results and goal.  Also discussed signs and symptoms of elevated CBG's and she endorses that she has a lack of energy.  She states that she see's Dr. Elsworth Soho as PCP and does not recall her last A1C.  She takes insulin via insulin pen.  I asked her if she ever see's insulin on her skin after she injects and she states sometimes.  I had patient demonstrate use of insulin pen using demo insulin pen and practice dome.  Noted that patient dialed up pen first and then put on insulin pen needle.  She did not prime needle and then injected quickly without holding in place the recommended 6-10 seconds.  I was able to re-train patient on use of insulin pen and showed her the importance of placing pen needle first, priming pen, and then dialing up dose. I also taught her to hold insulin pen in place for  6-10 seconds in order to ensure that all of the insulin has injected.  Patient states, "thanks for showing me, I wasn't doing it right".  She had lots of questions regarding diet and states I eat everything all the time.  Note that patient is only on basal insulin at this time which is technically designed for basal insulin needs and not food.  Explained this to patient and the action of the Lantus.  She states "I assumed that it covered my food too".  Patient is agreeable to go to outpatient diabetes education and state that she has a family member who would like to go with her.    Explained the need for close follow-up with PCP and further education.  Patient verbalized understanding.  Also discussed hypoglycemia signs and symptoms and proper treatment.  Patient has had hypoglycemia before, but not recently.  She was able to teach back proper treatment of low blood sugars.  Overall patient was very engaged in teaching and education and states that she has never been told much about how to manage her diabetes.  Ordered Living Well with diabetes booklet and dietician consult.  Thanks, Adah Perl, RN, BC-ADM Inpatient Diabetes Coordinator Pager [phone] (8a-5p)

## 2015-02-24 NOTE — Care Management Important Message (Signed)
Important Message  Patient Details  Name: JOANE POSTEL MRN: [id number] Date of Birth: [birth year]   Medicare Important Message Given:  Yes-second notification given    Juliann Pulse A Akhil Piscopo 02/24/2015, 9:22 AM

## 2015-02-24 NOTE — Progress Notes (Signed)
IV to PO Policy Change for Antimicrobials:  Patient is a 49 yo female receiving Levaquin 750 mg IV q24h x 5 doses for UTI.  Patient meets following criteria for IV to PO change:   1.  Patient has White Blood Count < 11.1 x 103/mm3    2.  Patient has maintained temperature < 99.5 Fahrenheit for twenty-four hours 3.  Patient has eaten > 50% of meals for twenty-four hours 4.  Patient is taking other medications by mouth  Will transition patient to Levaquin 750 mg po daily for 3 more days to complete 5 day course.    Murrell Converse, PharmD Clinical Pharmacist 02/24/2015

## 2015-02-26 LAB — URINE CULTURE

## 2015-03-04 ENCOUNTER — Ambulatory Visit: Payer: Self-pay | Admitting: *Deleted

## 2016-08-20 ENCOUNTER — Emergency Department: Payer: Medicare Other

## 2016-08-20 ENCOUNTER — Emergency Department
Admission: EM | Admit: 2016-08-20 | Discharge: 2016-08-20 | Disposition: A | Discharge status: Home / Self Care | Payer: Medicare Other | Attending: Emergency Medicine | Admitting: Emergency Medicine

## 2016-08-20 ENCOUNTER — Encounter: Payer: Self-pay | Admitting: Emergency Medicine

## 2016-08-20 DIAGNOSIS — I10 Essential (primary) hypertension: Secondary | ICD-10-CM | POA: Diagnosis not present

## 2016-08-20 DIAGNOSIS — G8929 Other chronic pain: Secondary | ICD-10-CM

## 2016-08-20 DIAGNOSIS — Z79899 Other long term (current) drug therapy: Secondary | ICD-10-CM | POA: Insufficient documentation

## 2016-08-20 DIAGNOSIS — F17210 Nicotine dependence, cigarettes, uncomplicated: Secondary | ICD-10-CM | POA: Diagnosis not present

## 2016-08-20 DIAGNOSIS — Z8541 Personal history of malignant neoplasm of cervix uteri: Secondary | ICD-10-CM | POA: Insufficient documentation

## 2016-08-20 DIAGNOSIS — Z794 Long term (current) use of insulin: Secondary | ICD-10-CM | POA: Diagnosis not present

## 2016-08-20 DIAGNOSIS — E119 Type 2 diabetes mellitus without complications: Secondary | ICD-10-CM | POA: Insufficient documentation

## 2016-08-20 DIAGNOSIS — R079 Chest pain, unspecified: Secondary | ICD-10-CM

## 2016-08-20 DIAGNOSIS — R0789 Other chest pain: Secondary | ICD-10-CM | POA: Insufficient documentation

## 2016-08-20 LAB — BASIC METABOLIC PANEL
Anion gap: 12 (ref 5–15)
BUN: 20 mg/dL (ref 6–20)
CALCIUM: 10.3 mg/dL (ref 8.9–10.3)
CO2: 25 mmol/L (ref 22–32)
Chloride: 99 mmol/L — ABNORMAL LOW (ref 101–111)
Creatinine, Ser: 1.59 mg/dL — ABNORMAL HIGH (ref 0.44–1.00)
GFR calc Af Amer: 43 mL/min — ABNORMAL LOW (ref >60)
GFR, EST NON AFRICAN AMERICAN: 37 mL/min — AB (ref >60)
GLUCOSE: 50 mg/dL — AB (ref 65–99)
Potassium: 3.4 mmol/L — ABNORMAL LOW (ref 3.5–5.1)
Sodium: 136 mmol/L (ref 135–145)

## 2016-08-20 LAB — TROPONIN I

## 2016-08-20 LAB — CBC
HCT: 36.8 % (ref 35.0–47.0)
Hemoglobin: 12.5 g/dL (ref 12.0–16.0)
MCH: 28.7 pg (ref 26.0–34.0)
MCHC: 34.1 g/dL (ref 32.0–36.0)
MCV: 84.2 fL (ref 80.0–100.0)
Platelets: 492 10*3/uL — ABNORMAL HIGH (ref 150–440)
RBC: 4.37 MIL/uL (ref 3.80–5.20)
RDW: 14.2 % (ref 11.5–14.5)
WBC: 17.7 10*3/uL — ABNORMAL HIGH (ref 3.6–11.0)

## 2016-08-20 LAB — GLUCOSE, CAPILLARY
GLUCOSE-CAPILLARY: 49 mg/dL — AB (ref 65–99)
Glucose-Capillary: 120 mg/dL — ABNORMAL HIGH (ref 65–99)

## 2016-08-20 MED ORDER — OXYCODONE-ACETAMINOPHEN 5-325 MG PO TABS
2.0000 | ORAL_TABLET | Freq: Once | ORAL | Status: AC
  Administered 2016-08-20: 2 via ORAL
  Filled 2016-08-20: qty 2

## 2016-08-20 MED ORDER — TRAMADOL HCL 50 MG PO TABS: 50.0000 mg | ORAL_TABLET | Freq: Four times a day (QID) | ORAL | 0 refills | Status: DC | PRN

## 2016-08-20 NOTE — ED Provider Notes (Signed)
[facility] Emergency Department Provider Note       Time seen: ----------------------------------------- 9:49 PM on 08/20/2016 -----------------------------------------     I have reviewed the triage vital signs and the nursing notes.   HISTORY   Chief Complaint Chest Pain    HPI Catherine Krueger is a 51 y.o. female who presents to the ED for chest pressure for the last year. Patient states she has not seen her cardiologist in 2 years and her primary physician is not taking her concerns seriously. Patient describes pain at 4 out of 10 in the chest and pressure. Patient states the pain is always there and has been so at least for the last year. She denies fevers, chills, vomiting or diarrhea.   Past Medical History:  Diagnosis Date  . Cervical cancer (HCC)    remission, dx at age 51  . Depression   . Diabetes mellitus, type II ([place])   . GERD (gastroesophageal reflux disease)   . H. pylori infection 2013  . HLD (hyperlipidemia)   . Hypertension     Patient Active Problem List   Diagnosis Date Noted  . Hypokalemia 02/24/2015  . Hyponatremia 02/24/2015  . Leukocytosis 02/24/2015  . Anemia 02/24/2015  . Obesity 02/24/2015  . Dehydration 02/24/2015  . Tobacco abuse 02/24/2015  . DKA (diabetic ketoacidoses) ([place]) 02/22/2015  . Depression 02/22/2015  . HLD (hyperlipidemia) 02/22/2015  . HTN (hypertension) 02/22/2015  . GERD (gastroesophageal reflux disease) 02/22/2015  . UTI (lower urinary tract infection) 02/22/2015  . AKI (acute kidney injury) ([place]) 02/22/2015    Past Surgical History:  Procedure Laterality Date  . ABDOMINAL HYSTERECTOMY    . CHOLECYSTECTOMY      Allergies Diclofenac sodium and Keflex [cephalexin]  Social History Social History  Substance Use Topics  . Smoking status: Current Every Day Smoker    Packs/day: 1.00    Types: Cigarettes  . Smokeless tobacco: Never Used  . Alcohol use No    Review of  Systems Constitutional: Negative for fever. Eyes: Negative for vision changes ENT:  Negative for congestion, sore throat Cardiovascular: Positive for chest pain Respiratory: Negative for shortness of breath. Gastrointestinal: Negative for abdominal pain, vomiting and diarrhea. Genitourinary: Negative for dysuria. Musculoskeletal: Negative for back pain. Skin: Negative for rash. Neurological: Negative for headaches, focal weakness or numbness.  All systems negative/normal/unremarkable except as stated in the HPI  ____________________________________________   PHYSICAL EXAM:  VITAL SIGNS: ED Triage Vitals  Enc Vitals Group     BP 08/20/16 2037 95/67     Pulse Rate 08/20/16 2034 (!) 112     Resp 08/20/16 2034 18     Temp 08/20/16 2034 98.7 F (37.1 C)     Temp Source 08/20/16 2034 Oral     SpO2 08/20/16 2034 98 %     Weight 08/20/16 2030 141 lb (64 kg)     Height 08/20/16 2030 5\' 2"  (1.575 m)     Head Circumference --      Peak Flow --      Pain Score 08/20/16 2026 4     Pain Loc --      Pain Edu? --      Excl. in [place]? --     Constitutional: Alert and oriented. Well appearing and in no distress. Eyes: Conjunctivae are normal. PERRL. Normal extraocular movements. ENT   Head: Normocephalic and atraumatic.   Nose: No congestion/rhinnorhea.   Mouth/Throat: Mucous membranes are moist.   Neck: No stridor. Cardiovascular: Normal rate,  regular rhythm. No murmurs, rubs, or gallops. Respiratory: Normal respiratory effort without tachypnea nor retractions. Breath sounds are clear and equal bilaterally. No wheezes/rales/rhonchi. Gastrointestinal: Soft and nontender. Normal bowel sounds Musculoskeletal: Nontender with normal range of motion in extremities. No lower extremity tenderness nor edema. Neurologic:  Normal speech and language. No gross focal neurologic deficits are appreciated.  Skin:  Skin is warm, dry and intact. No rash noted. Psychiatric: Mood and affect  are normal. Speech and behavior are normal.  ____________________________________________  EKG: Interpreted by me.Sinus tachycardia rate 102 bpm, normal PR interval, normal QRS, long QT.  ____________________________________________  ED COURSE:  Pertinent labs & imaging results that were available during my care of the patient were reviewed by me and considered in my medical decision making (see chart for details). Patient presents for chronic chest pain, we will assess with labs and imaging as indicated.   Procedures ____________________________________________   LABS (pertinent positives/negatives)  Labs Reviewed  BASIC METABOLIC PANEL - Abnormal; Notable for the following:       Result Value   Potassium 3.4 (*)    Chloride 99 (*)    Glucose, Bld 50 (*)    Creatinine, Ser 1.59 (*)    GFR calc non Af Amer 37 (*)    GFR calc Af Amer 43 (*)    All other components within normal limits  CBC - Abnormal; Notable for the following:    WBC 17.7 (*)    Platelets 492 (*)    All other components within normal limits  TROPONIN I    RADIOLOGY Images were viewed by me  Chest x-ray is normal CT of the chest  IMPRESSION: 1. Clear lung fields, no pneumothorax pleural effusion or infiltrate. 2. Scattered tiny pulmonary nodules. No follow-up needed if patient is low-risk (and has no known or suspected primary neoplasm). Non-contrast chest CT can be considered in 12 months if patient is high-risk. This recommendation follows the consensus statement: Guidelines for Management of Incidental Pulmonary Nodules Detected on CT Images: From the Fleischner Society 2017; Radiology 2017; 284:228-243. ____________________________________________  FINAL ASSESSMENT AND PLAN  Chronic chest pain  Plan: Patient's labs and imaging were dictated above. Patient had presented for Chronic chest pain. No specific etiology is identified. She'll be discharged with pain medicine and referred to  cardiology for outpatient follow-up.   Earleen Newport, MD   Note: This note was generated in part or whole with voice recognition software. Voice recognition is usually quite accurate but there are transcription errors that can and very often do occur. I apologize for any typographical errors that were not detected and corrected.     Earleen Newport, MD 08/20/16 2239

## 2016-08-20 NOTE — ED Triage Notes (Signed)
Pt arrives ambulatory to triage with c/o chest pressure x 1 year. Pt states that she has not seen her cardiologist x 2 years and her primary physician is not taking her concern seriously. Pt is in NAD at this time.

## 2016-08-20 NOTE — ED Notes (Signed)
Pt provided with regular ginger ale and peanut butter and crackers due to low blood sugar.

## 2016-10-31 ENCOUNTER — Emergency Department
Admission: EM | Admit: 2016-10-31 | Discharge: 2016-10-31 | Disposition: A | Discharge status: Home / Self Care | Payer: Medicare Other | Attending: Emergency Medicine | Admitting: Emergency Medicine

## 2016-10-31 DIAGNOSIS — Z7984 Long term (current) use of oral hypoglycemic drugs: Secondary | ICD-10-CM | POA: Insufficient documentation

## 2016-10-31 DIAGNOSIS — R1084 Generalized abdominal pain: Secondary | ICD-10-CM | POA: Diagnosis not present

## 2016-10-31 DIAGNOSIS — N390 Urinary tract infection, site not specified: Secondary | ICD-10-CM

## 2016-10-31 DIAGNOSIS — I10 Essential (primary) hypertension: Secondary | ICD-10-CM | POA: Insufficient documentation

## 2016-10-31 DIAGNOSIS — Z79899 Other long term (current) drug therapy: Secondary | ICD-10-CM | POA: Diagnosis not present

## 2016-10-31 DIAGNOSIS — Z794 Long term (current) use of insulin: Secondary | ICD-10-CM | POA: Insufficient documentation

## 2016-10-31 DIAGNOSIS — E119 Type 2 diabetes mellitus without complications: Secondary | ICD-10-CM | POA: Insufficient documentation

## 2016-10-31 DIAGNOSIS — F17210 Nicotine dependence, cigarettes, uncomplicated: Secondary | ICD-10-CM | POA: Insufficient documentation

## 2016-10-31 DIAGNOSIS — R1032 Left lower quadrant pain: Secondary | ICD-10-CM | POA: Diagnosis present

## 2016-10-31 LAB — COMPREHENSIVE METABOLIC PANEL
ALK PHOS: 80 U/L (ref 38–126)
ALT: 13 U/L — AB (ref 14–54)
AST: 14 U/L — ABNORMAL LOW (ref 15–41)
Albumin: 3.9 g/dL (ref 3.5–5.0)
Anion gap: 14 (ref 5–15)
BUN: 13 mg/dL (ref 6–20)
CALCIUM: 9.9 mg/dL (ref 8.9–10.3)
CO2: 20 mmol/L — ABNORMAL LOW (ref 22–32)
CREATININE: 0.71 mg/dL (ref 0.44–1.00)
Chloride: 101 mmol/L (ref 101–111)
GFR calc non Af Amer: >60 mL/min (ref >60)
Glucose, Bld: 288 mg/dL — ABNORMAL HIGH (ref 65–99)
Potassium: 4.1 mmol/L (ref 3.5–5.1)
Sodium: 135 mmol/L (ref 135–145)
TOTAL PROTEIN: 7.5 g/dL (ref 6.5–8.1)
Total Bilirubin: 1.3 mg/dL — ABNORMAL HIGH (ref 0.3–1.2)

## 2016-10-31 LAB — URINALYSIS, COMPLETE (UACMP) WITH MICROSCOPIC
BACTERIA UA: NONE SEEN
BILIRUBIN URINE: NEGATIVE
Glucose, UA: >=500 mg/dL — AB
HGB URINE DIPSTICK: NEGATIVE
Ketones, ur: 80 mg/dL — AB
NITRITE: NEGATIVE
PROTEIN: NEGATIVE mg/dL
Specific Gravity, Urine: 1.025 (ref 1.005–1.030)
pH: 5 (ref 5.0–8.0)

## 2016-10-31 LAB — CBC
HEMATOCRIT: 37.6 % (ref 35.0–47.0)
HEMOGLOBIN: 12.5 g/dL (ref 12.0–16.0)
MCH: 29.8 pg (ref 26.0–34.0)
MCHC: 33.3 g/dL (ref 32.0–36.0)
MCV: 89.4 fL (ref 80.0–100.0)
Platelets: 472 10*3/uL — ABNORMAL HIGH (ref 150–440)
RBC: 4.21 MIL/uL (ref 3.80–5.20)
RDW: 13.1 % (ref 11.5–14.5)
WBC: 9.8 10*3/uL (ref 3.6–11.0)

## 2016-10-31 LAB — LIPASE, BLOOD: Lipase: 86 U/L — ABNORMAL HIGH (ref 11–51)

## 2016-10-31 MED ORDER — NITROFURANTOIN MONOHYD MACRO 100 MG PO CAPS: 100.0000 mg | ORAL_CAPSULE | Freq: Two times a day (BID) | ORAL | 0 refills | Status: AC

## 2016-10-31 NOTE — Discharge Instructions (Signed)
Please seek medical attention for any high fevers, chest pain, shortness of breath, change in behavior, persistent vomiting, bloody stool or any other new or concerning symptoms.  

## 2016-10-31 NOTE — ED Notes (Signed)
Pt states she is a diabetic and hasn't been eating or drinking as much. Pt c/o nausea as well.

## 2016-10-31 NOTE — ED Provider Notes (Signed)
[facility] Emergency Department Provider Note   ____________________________________________   I have reviewed the triage vital signs and the nursing notes.   HISTORY  Chief Complaint Abdominal Pain   History limited by: Not Limited   HPI Catherine Krueger is a 51 y.o. female who presents to the emergency department today because of concerns for abdominal pain. Is located on the left side. She states it is been going on for years. It will come and go and last for months. This is now the second time the patient states the discomfort as severe. It happened earlier in the spring and lasted for 3 months. Lightly unclear why the patient decided to come to the emergency department today. It does not sound that any new symptoms have developed. She has not had any associated vomiting or change in stool. No fevers.   Past Medical History:  Diagnosis Date  . Cervical cancer (HCC)    remission, dx at age 57  . Depression   . Diabetes mellitus, type II ([place])   . GERD (gastroesophageal reflux disease)   . H. pylori infection 2013  . HLD (hyperlipidemia)   . Hypertension     Patient Active Problem List   Diagnosis Date Noted  . Hypokalemia 02/24/2015  . Hyponatremia 02/24/2015  . Leukocytosis 02/24/2015  . Anemia 02/24/2015  . Obesity 02/24/2015  . Dehydration 02/24/2015  . Tobacco abuse 02/24/2015  . DKA (diabetic ketoacidoses) ([place]) 02/22/2015  . Depression 02/22/2015  . HLD (hyperlipidemia) 02/22/2015  . HTN (hypertension) 02/22/2015  . GERD (gastroesophageal reflux disease) 02/22/2015  . UTI (lower urinary tract infection) 02/22/2015  . AKI (acute kidney injury) ([place]) 02/22/2015    Past Surgical History:  Procedure Laterality Date  . ABDOMINAL HYSTERECTOMY    . CHOLECYSTECTOMY      Prior to Admission medications   Medication Sig Start Date End Date Taking? Authorizing Provider  acetaminophen (TYLENOL) 500 MG tablet Take 500 mg by mouth every 6  (six) hours as needed.    [provider]  amitriptyline (ELAVIL) 25 MG tablet Take 25 mg by mouth at bedtime.    [provider]  atorvastatin (LIPITOR) 40 MG tablet Take 40 mg by mouth daily.    [provider]  ESOMEPRAZOLE MAGNESIUM PO Take 1 tablet by mouth daily.    [provider]  fluticasone (FLONASE) 50 MCG/ACT nasal spray Place 2 sprays into both nostrils daily.    [provider]  insulin aspart (NOVOLOG) 100 UNIT/ML injection Inject 0-24 Units into the skin every 4 (four) hours. 02/24/15   Theodoro Grist, MD  insulin glargine (LANTUS) 100 UNIT/ML injection Inject 1 mL (100 Units total) into the skin daily. 02/24/15   Theodoro Grist, MD  levofloxacin (LEVAQUIN) 750 MG tablet Take 1 tablet (750 mg total) by mouth daily. 02/25/15   Theodoro Grist, MD  lisinopril (PRINIVIL,ZESTRIL) 40 MG tablet Take 40 mg by mouth daily.    [provider]  metFORMIN (GLUCOPHAGE) 1000 MG tablet Take 1,000 mg by mouth 2 (two) times daily with a meal.    [provider]  nicotine (NICODERM CQ - DOSED IN MG/24 HOURS) 21 mg/24hr patch Place 1 patch (21 mg total) onto the skin daily. 02/24/15   Theodoro Grist, MD  nicotine polacrilex (NICORETTE) 2 MG gum Take 1 each (2 mg total) by mouth as needed for smoking cessation. 02/24/15   Theodoro Grist, MD  RELION PEN NEEDLES 29G X 12MM MISC  08/09/14   [provider]  sucralfate (CARAFATE) 1 G tablet Take 1 g by mouth 2 (two) times daily.    [provider]  traMADol (ULTRAM) 50 MG tablet Take 1 tablet (50 mg total) by mouth every 6 (six) hours as needed. 08/20/16 08/20/17  Earleen Newport, MD  venlafaxine XR (EFFEXOR-XR) 150 MG 24 hr capsule Take 150 mg by mouth daily with breakfast.    [provider]    Allergies Diclofenac sodium and Keflex [cephalexin]  Family History  Problem Relation Age of Onset  . Diabetes Mother   . Heart attack Father   . Heart attack Brother    . Diabetes Brother     Social History Social History  Substance Use Topics  . Smoking status: Current Every Day Smoker    Packs/day: 1.00    Types: Cigarettes  . Smokeless tobacco: Never Used  . Alcohol use No    Review of Systems Constitutional: No fever/chills Eyes: No visual changes. ENT: No sore throat. Cardiovascular: Denies chest pain. Respiratory: Denies shortness of breath. Gastrointestinal: Positive for abdominal pain   Genitourinary: Negative for dysuria. Musculoskeletal: Negative for back pain. Skin: Negative for rash. Neurological: Negative for headaches, focal weakness or numbness.  ____________________________________________   PHYSICAL EXAM:  VITAL SIGNS: ED Triage Vitals  Enc Vitals Group     BP 10/31/16 0848 139/86     Pulse Rate 10/31/16 0848 91     Resp 10/31/16 0848 18     Temp 10/31/16 0848 98.2 F (36.8 C)     Temp Source 10/31/16 0848 Oral     SpO2 10/31/16 0848 100 %     Weight 10/31/16 0849 135 lb (61.2 kg)     Height 10/31/16 0849 5\' 3"  (1.6 m)     Head Circumference --      Peak Flow --      Pain Score 10/31/16 0848 7   Constitutional: Alert and oriented. Well appearing and in no distress. Eyes: Conjunctivae are normal.  ENT   Head: Normocephalic and atraumatic.   Nose: No congestion/rhinnorhea.   Mouth/Throat: Mucous membranes are moist.   Neck: No stridor. Hematological/Lymphatic/Immunilogical: No cervical lymphadenopathy. Cardiovascular: Normal rate, regular rhythm.  No murmurs, rubs, or gallops.  Respiratory: Normal respiratory effort without tachypnea nor retractions. Breath sounds are clear and equal bilaterally. No wheezes/rales/rhonchi. Gastrointestinal: Soft and Minimally tender in the left upper quadrant. No rebound. No guarding.  Genitourinary: Deferred Musculoskeletal: Normal range of motion in all extremities. No lower extremity edema. Neurologic:  Normal speech and language. No gross focal neurologic  deficits are appreciated.  Skin:  Skin is warm, dry and intact. No rash noted. Psychiatric: Mood and affect are normal. Speech and behavior are normal. Patient exhibits appropriate insight and judgment.  ____________________________________________    LABS (pertinent positives/negatives)  Labs Reviewed  LIPASE, BLOOD - Abnormal; Notable for the following:       Result Value   Lipase 86 (*)    All other components within normal limits  COMPREHENSIVE METABOLIC PANEL - Abnormal; Notable for the following:    CO2 20 (*)    Glucose, Bld 288 (*)    AST 14 (*)    ALT 13 (*)    Total Bilirubin 1.3 (*)    All other components within normal limits  CBC - Abnormal; Notable for the following:    Platelets 472 (*)    All other components within normal limits  URINALYSIS, COMPLETE (UACMP) WITH MICROSCOPIC - Abnormal; Notable for the following:  Color, Urine STRAW (*)    APPearance CLEAR (*)    Glucose, UA >=500 (*)    Ketones, ur 80 (*)    Leukocytes, UA TRACE (*)    Squamous Epithelial / LPF 0-5 (*)    All other components within normal limits     ____________________________________________   EKG  None  ____________________________________________    RADIOLOGY  None  ____________________________________________   PROCEDURES  Procedures  ____________________________________________   INITIAL IMPRESSION / ASSESSMENT AND PLAN / ED COURSE  Pertinent labs & imaging results that were available during my care of the patient were reviewed by me and considered in my medical decision making (see chart for details).  Patient presented to the emergency department today with somewhat chronic abdominal pain. The patient states that it has been going on and off for years. On exam she is mildly tender in the left side. Urine is consistent with a urinary tract infection. This could be explaining why the pain is slightly worse recently. This point I do not feel any emergent  imaging is necessary. No leukocytosis to suggest intra-abdominal infection. Prescription for antibiotics.  ____________________________________________   FINAL CLINICAL IMPRESSION(S) / ED DIAGNOSES  Final diagnoses:  Generalized abdominal pain  Lower urinary tract infectious disease     Note: This dictation was prepared with Dragon dictation. Any transcriptional errors that result from this process are unintentional     Nance Pear, MD 10/31/16 [phone]

## 2016-10-31 NOTE — ED Triage Notes (Signed)
Pt c/o left side abdominal pain that is constant but states its hurts more when she eats and drinks anything.  Pt reports abdominal pain started about 3 weeks ago and getting worse. Pt ambulatory in triage.

## 2016-11-30 ENCOUNTER — Inpatient Hospital Stay
Admission: EM | Admit: 2016-11-30 | Discharge: 2016-12-03 | DRG: 638 | Disposition: A | Discharge status: Home / Self Care | Payer: Medicare Other | Attending: Internal Medicine | Admitting: Internal Medicine

## 2016-11-30 ENCOUNTER — Encounter: Payer: Self-pay | Admitting: Emergency Medicine

## 2016-11-30 DIAGNOSIS — F329 Major depressive disorder, single episode, unspecified: Secondary | ICD-10-CM | POA: Diagnosis present

## 2016-11-30 DIAGNOSIS — E10649 Type 1 diabetes mellitus with hypoglycemia without coma: Secondary | ICD-10-CM | POA: Diagnosis present

## 2016-11-30 DIAGNOSIS — E1010 Type 1 diabetes mellitus with ketoacidosis without coma: Secondary | ICD-10-CM | POA: Diagnosis not present

## 2016-11-30 DIAGNOSIS — K219 Gastro-esophageal reflux disease without esophagitis: Secondary | ICD-10-CM | POA: Diagnosis present

## 2016-11-30 DIAGNOSIS — Z9114 Patient's other noncompliance with medication regimen: Secondary | ICD-10-CM | POA: Diagnosis not present

## 2016-11-30 DIAGNOSIS — E0810 Diabetes mellitus due to underlying condition with ketoacidosis without coma: Secondary | ICD-10-CM | POA: Diagnosis not present

## 2016-11-30 DIAGNOSIS — Z8541 Personal history of malignant neoplasm of cervix uteri: Secondary | ICD-10-CM | POA: Diagnosis not present

## 2016-11-30 DIAGNOSIS — I10 Essential (primary) hypertension: Secondary | ICD-10-CM | POA: Diagnosis present

## 2016-11-30 DIAGNOSIS — F17210 Nicotine dependence, cigarettes, uncomplicated: Secondary | ICD-10-CM | POA: Diagnosis present

## 2016-11-30 DIAGNOSIS — Z79899 Other long term (current) drug therapy: Secondary | ICD-10-CM

## 2016-11-30 DIAGNOSIS — E1110 Type 2 diabetes mellitus with ketoacidosis without coma: Secondary | ICD-10-CM | POA: Diagnosis present

## 2016-11-30 DIAGNOSIS — N179 Acute kidney failure, unspecified: Secondary | ICD-10-CM | POA: Diagnosis present

## 2016-11-30 DIAGNOSIS — R17 Unspecified jaundice: Secondary | ICD-10-CM | POA: Diagnosis present

## 2016-11-30 DIAGNOSIS — E871 Hypo-osmolality and hyponatremia: Secondary | ICD-10-CM | POA: Diagnosis present

## 2016-11-30 DIAGNOSIS — K8689 Other specified diseases of pancreas: Secondary | ICD-10-CM | POA: Diagnosis present

## 2016-11-30 DIAGNOSIS — E785 Hyperlipidemia, unspecified: Secondary | ICD-10-CM | POA: Diagnosis present

## 2016-11-30 DIAGNOSIS — R112 Nausea with vomiting, unspecified: Secondary | ICD-10-CM | POA: Diagnosis not present

## 2016-11-30 LAB — COMPREHENSIVE METABOLIC PANEL
ALT: 10 U/L — ABNORMAL LOW (ref 14–54)
AST: 16 U/L (ref 15–41)
Albumin: 4.2 g/dL (ref 3.5–5.0)
Alkaline Phosphatase: 90 U/L (ref 38–126)
Anion gap: 28 — ABNORMAL HIGH (ref 5–15)
BUN: 23 mg/dL — ABNORMAL HIGH (ref 6–20)
CHLORIDE: 92 mmol/L — AB (ref 101–111)
CO2: 8 mmol/L — ABNORMAL LOW (ref 22–32)
Calcium: 9.9 mg/dL (ref 8.9–10.3)
Creatinine, Ser: 1.68 mg/dL — ABNORMAL HIGH (ref 0.44–1.00)
GFR, EST AFRICAN AMERICAN: 40 mL/min — AB (ref >60)
GFR, EST NON AFRICAN AMERICAN: 34 mL/min — AB (ref >60)
Glucose, Bld: 643 mg/dL (ref 65–99)
POTASSIUM: 5.2 mmol/L — AB (ref 3.5–5.1)
Sodium: 128 mmol/L — ABNORMAL LOW (ref 135–145)
Total Bilirubin: 2 mg/dL — ABNORMAL HIGH (ref 0.3–1.2)
Total Protein: 8.1 g/dL (ref 6.5–8.1)

## 2016-11-30 LAB — CBC WITH DIFFERENTIAL/PLATELET
BASOS ABS: 0.1 10*3/uL (ref 0–0.1)
Basophils Relative: 1 %
EOS ABS: 0 10*3/uL (ref 0–0.7)
EOS PCT: 0 %
HCT: 42.8 % (ref 35.0–47.0)
Hemoglobin: 14.1 g/dL (ref 12.0–16.0)
LYMPHS PCT: 14 %
Lymphs Abs: 1.5 10*3/uL (ref 1.0–3.6)
MCH: 29.9 pg (ref 26.0–34.0)
MCHC: 33.1 g/dL (ref 32.0–36.0)
MCV: 90.3 fL (ref 80.0–100.0)
MONO ABS: 0.4 10*3/uL (ref 0.2–0.9)
Monocytes Relative: 4 %
Neutro Abs: 8.7 10*3/uL — ABNORMAL HIGH (ref 1.4–6.5)
Neutrophils Relative %: 81 %
PLATELETS: 509 10*3/uL — AB (ref 150–440)
RBC: 4.74 MIL/uL (ref 3.80–5.20)
RDW: 13.5 % (ref 11.5–14.5)
WBC: 10.7 10*3/uL (ref 3.6–11.0)

## 2016-11-30 LAB — BASIC METABOLIC PANEL
ANION GAP: 16 — AB (ref 5–15)
ANION GAP: 9 (ref 5–15)
BUN: 19 mg/dL (ref 6–20)
BUN: 21 mg/dL — ABNORMAL HIGH (ref 6–20)
CHLORIDE: 103 mmol/L (ref 101–111)
CHLORIDE: 104 mmol/L (ref 101–111)
CO2: 15 mmol/L — ABNORMAL LOW (ref 22–32)
CO2: 20 mmol/L — ABNORMAL LOW (ref 22–32)
Calcium: 9.4 mg/dL (ref 8.9–10.3)
Calcium: 8.8 mg/dL — ABNORMAL LOW (ref 8.9–10.3)
Creatinine, Ser: 1.08 mg/dL — ABNORMAL HIGH (ref 0.44–1.00)
Creatinine, Ser: 1.09 mg/dL — ABNORMAL HIGH (ref 0.44–1.00)
GFR calc Af Amer: >60 mL/min (ref >60)
GFR calc Af Amer: >60 mL/min (ref >60)
GFR, EST NON AFRICAN AMERICAN: 59 mL/min — AB (ref >60)
GFR, EST NON AFRICAN AMERICAN: 58 mL/min — AB (ref >60)
GLUCOSE: 245 mg/dL — AB (ref 65–99)
Glucose, Bld: 146 mg/dL — ABNORMAL HIGH (ref 65–99)
POTASSIUM: 4.6 mmol/L (ref 3.5–5.1)
POTASSIUM: 3.9 mmol/L (ref 3.5–5.1)
SODIUM: 134 mmol/L — AB (ref 135–145)
SODIUM: 133 mmol/L — AB (ref 135–145)

## 2016-11-30 LAB — GLUCOSE, CAPILLARY
GLUCOSE-CAPILLARY: 531 mg/dL — AB (ref 65–99)
GLUCOSE-CAPILLARY: 250 mg/dL — AB (ref 65–99)
GLUCOSE-CAPILLARY: 311 mg/dL — AB (ref 65–99)
GLUCOSE-CAPILLARY: 180 mg/dL — AB (ref 65–99)
GLUCOSE-CAPILLARY: 142 mg/dL — AB (ref 65–99)
Glucose-Capillary: 140 mg/dL — ABNORMAL HIGH (ref 65–99)
Glucose-Capillary: 147 mg/dL — ABNORMAL HIGH (ref 65–99)
Glucose-Capillary: 305 mg/dL — ABNORMAL HIGH (ref 65–99)
Glucose-Capillary: >600 mg/dL (ref 65–99)

## 2016-11-30 LAB — URINALYSIS, COMPLETE (UACMP) WITH MICROSCOPIC
BILIRUBIN URINE: NEGATIVE
Bacteria, UA: NONE SEEN
Glucose, UA: >=500 mg/dL — AB
HGB URINE DIPSTICK: NEGATIVE
Ketones, ur: 80 mg/dL — AB
LEUKOCYTES UA: NEGATIVE
Nitrite: NEGATIVE
PH: 5 (ref 5.0–8.0)
Protein, ur: NEGATIVE mg/dL
SPECIFIC GRAVITY, URINE: 1.021 (ref 1.005–1.030)

## 2016-11-30 LAB — TROPONIN I

## 2016-11-30 LAB — BETA-HYDROXYBUTYRIC ACID

## 2016-11-30 LAB — MRSA PCR SCREENING: MRSA BY PCR: NEGATIVE

## 2016-11-30 MED ORDER — ONDANSETRON HCL 4 MG/2ML IJ SOLN
4.0000 mg | Freq: Once | INTRAMUSCULAR | Status: AC
  Administered 2016-11-30: 4 mg via INTRAVENOUS

## 2016-11-30 MED ORDER — MORPHINE SULFATE (PF) 4 MG/ML IV SOLN: 4.0000 mg | INTRAVENOUS | Status: DC | PRN

## 2016-11-30 MED ORDER — ATORVASTATIN CALCIUM 20 MG PO TABS
40.0000 mg | ORAL_TABLET | Freq: Every day | ORAL | Status: DC
  Administered 2016-11-30 – 2016-12-02 (×3): 40 mg via ORAL
  Filled 2016-11-30 (×3): qty 2

## 2016-11-30 MED ORDER — INSULIN ASPART 100 UNIT/ML ~~LOC~~ SOLN
10.0000 [IU] | Freq: Once | SUBCUTANEOUS | Status: AC
  Administered 2016-11-30: 10 [IU] via SUBCUTANEOUS
  Filled 2016-11-30: qty 1

## 2016-11-30 MED ORDER — SODIUM CHLORIDE 0.9 % IV SOLN: INTRAVENOUS | Status: AC

## 2016-11-30 MED ORDER — LISINOPRIL 20 MG PO TABS
40.0000 mg | ORAL_TABLET | Freq: Every day | ORAL | Status: DC
  Administered 2016-11-30 – 2016-12-02 (×3): 40 mg via ORAL
  Filled 2016-11-30 (×3): qty 2

## 2016-11-30 MED ORDER — SODIUM CHLORIDE 0.9 % IV BOLUS (SEPSIS)
500.0000 mL | Freq: Once | INTRAVENOUS | Status: AC
  Administered 2016-11-30: 500 mL via INTRAVENOUS

## 2016-11-30 MED ORDER — SODIUM CHLORIDE 0.9 % IV SOLN
INTRAVENOUS | Status: DC
  Administered 2016-11-30: 4.7 [IU]/h via INTRAVENOUS
  Filled 2016-11-30: qty 1

## 2016-11-30 MED ORDER — NICOTINE 21 MG/24HR TD PT24
21.0000 mg | MEDICATED_PATCH | Freq: Every day | TRANSDERMAL | Status: DC
  Administered 2016-11-30 – 2016-12-03 (×4): 21 mg via TRANSDERMAL
  Filled 2016-11-30 (×4): qty 1

## 2016-11-30 MED ORDER — OXYCODONE-ACETAMINOPHEN 5-325 MG PO TABS
1.0000 | ORAL_TABLET | Freq: Four times a day (QID) | ORAL | Status: DC | PRN
  Administered 2016-11-30 – 2016-12-02 (×4): 1 via ORAL
  Filled 2016-11-30 (×4): qty 1

## 2016-11-30 MED ORDER — ONDANSETRON HCL 4 MG/2ML IJ SOLN
INTRAMUSCULAR | Status: AC
  Filled 2016-11-30: qty 2

## 2016-11-30 MED ORDER — DEXTROSE-NACL 5-0.45 % IV SOLN
INTRAVENOUS | Status: DC
Start: 2016-11-30 — End: 2016-12-01
  Administered 2016-11-30: 22:00:00 via INTRAVENOUS

## 2016-11-30 MED ORDER — ONDANSETRON HCL 4 MG/2ML IJ SOLN
4.0000 mg | Freq: Four times a day (QID) | INTRAMUSCULAR | Status: DC | PRN
  Administered 2016-11-30 – 2016-12-01 (×2): 4 mg via INTRAVENOUS
  Filled 2016-11-30 (×2): qty 2

## 2016-11-30 MED ORDER — ENOXAPARIN SODIUM 40 MG/0.4ML ~~LOC~~ SOLN
40.0000 mg | SUBCUTANEOUS | Status: DC
  Administered 2016-11-30 – 2016-12-02 (×3): 40 mg via SUBCUTANEOUS
  Filled 2016-11-30 (×3): qty 0.4

## 2016-11-30 MED ORDER — SUCRALFATE 1 G PO TABS
1.0000 g | ORAL_TABLET | Freq: Two times a day (BID) | ORAL | Status: DC
  Administered 2016-11-30 – 2016-12-03 (×6): 1 g via ORAL
  Filled 2016-11-30 (×6): qty 1

## 2016-11-30 MED ORDER — INSULIN GLARGINE 100 UNIT/ML ~~LOC~~ SOLN
40.0000 [IU] | Freq: Two times a day (BID) | SUBCUTANEOUS | Status: DC
  Administered 2016-11-30 – 2016-12-02 (×4): 40 [IU] via SUBCUTANEOUS
  Filled 2016-11-30 (×5): qty 0.4

## 2016-11-30 MED ORDER — ACETAMINOPHEN 325 MG PO TABS
650.0000 mg | ORAL_TABLET | Freq: Four times a day (QID) | ORAL | Status: DC | PRN
  Administered 2016-12-01: 650 mg via ORAL
  Filled 2016-11-30: qty 2

## 2016-11-30 MED ORDER — POTASSIUM CHLORIDE 10 MEQ/100ML IV SOLN
10.0000 meq | INTRAVENOUS | Status: DC
  Filled 2016-11-30 (×2): qty 100

## 2016-11-30 MED ORDER — SODIUM CHLORIDE 0.9 % IV SOLN
INTRAVENOUS | Status: DC
  Administered 2016-11-30: 16:00:00 via INTRAVENOUS

## 2016-11-30 MED ORDER — INSULIN ASPART 100 UNIT/ML ~~LOC~~ SOLN
0.0000 [IU] | Freq: Three times a day (TID) | SUBCUTANEOUS | Status: DC
  Administered 2016-12-01: 8 [IU] via SUBCUTANEOUS
  Administered 2016-12-01 (×2): 5 [IU] via SUBCUTANEOUS
  Filled 2016-11-30 (×3): qty 1

## 2016-11-30 MED ORDER — SODIUM CHLORIDE 0.9 % IV SOLN
Freq: Once | INTRAVENOUS | Status: AC
  Administered 2016-11-30: 14:00:00 via INTRAVENOUS

## 2016-11-30 MED ORDER — SODIUM CHLORIDE 0.9 % IV SOLN: INTRAVENOUS | Status: DC

## 2016-11-30 NOTE — Consult Note (Signed)
Name: Catherine Krueger MRN: [id number] DOB: Oct 05, 1965    ADMISSION DATE:  11/30/2016  CONSULTATION DATE: 11/30/16  REFERRING MD :  Bridgett Larsson  CHIEF COMPLAINT:  DKA  BRIEF PATIENT DESCRIPTION: 51 yo female with DKA  SIGNIFICANT EVENTS  11/30/16>> Patient admitted to the SDU with DKA  STUDIES:  None  HISTORY OF PRESENT ILLNESS:  Catherine Krueger is a 51 year old female with known history of Cervical Cancer, Depression, DM,GERD and Hypertension. Patient states that she has had left sided abdominal pain for the last 3 months and nausea and vomiting for 2 days.  According to her sister she is not compliant with her medications and insulin and sometimes she run out of medication. She presented to ED  On 8/7 with Blood glucose of 643mg /dl.  Her VBG- 7.07/22 and HCO3-6.4.  Patient was admitted to the SDU with Insulin gtt.  PAST MEDICAL HISTORY :   has a past medical history of Cervical cancer ([place]); Depression; Diabetes mellitus, type II ([place]); GERD (gastroesophageal reflux disease); H. pylori infection (2013); HLD (hyperlipidemia); and Hypertension.  has a past surgical history that includes Abdominal hysterectomy and Cholecystectomy. Prior to Admission medications   Medication Sig Start Date End Date Taking? Authorizing Provider  atorvastatin (LIPITOR) 40 MG tablet Take 40 mg by mouth daily.   Yes [provider]  insulin aspart (NOVOLOG) 100 UNIT/ML injection Inject 0-24 Units into the skin every 4 (four) hours. 02/24/15  Yes Theodoro Grist, MD  insulin glargine (LANTUS) 100 UNIT/ML injection Inject 1 mL (100 Units total) into the skin daily. Patient taking differently: Inject 80 Units into the skin daily.  02/24/15  Yes Theodoro Grist, MD  lisinopril (PRINIVIL,ZESTRIL) 40 MG tablet Take 40 mg by mouth daily.   Yes [provider]  metFORMIN (GLUCOPHAGE) 1000 MG tablet Take 1,000 mg by mouth 2 (two) times daily with a meal.   Yes [provider]  ondansetron (ZOFRAN) 4 MG  tablet Take 4 mg by mouth 3 (three) times daily as needed for nausea. 11/04/16  Yes [provider]  sucralfate (CARAFATE) 1 G tablet Take 1 g by mouth 2 (two) times daily.   Yes [provider]  nicotine (NICODERM CQ - DOSED IN MG/24 HOURS) 21 mg/24hr patch Place 1 patch (21 mg total) onto the skin daily. Patient not taking: Reported on 11/30/2016 02/24/15   Theodoro Grist, MD  nicotine polacrilex (NICORETTE) 2 MG gum Take 1 each (2 mg total) by mouth as needed for smoking cessation. Patient not taking: Reported on 11/30/2016 02/24/15   Theodoro Grist, MD  traMADol (ULTRAM) 50 MG tablet Take 1 tablet (50 mg total) by mouth every 6 (six) hours as needed. Patient not taking: Reported on 11/30/2016 08/20/16 08/20/17  Earleen Newport, MD   Allergies  Allergen Reactions  . Diclofenac Sodium Other (See Comments)  . Keflex [Cephalexin] Palpitations    FAMILY HISTORY:  family history includes Diabetes in her brother and mother; Heart attack in her brother and father. SOCIAL HISTORY:  reports that she has been smoking Cigarettes.  She has been smoking about 1.00 pack per day. She has never used smokeless tobacco. She reports that she does not drink alcohol or use drugs.  REVIEW OF SYSTEMS:   Constitutional: Negative for fever, chills, weight loss, malaise/fatigue and diaphoresis.  HENT: Negative for hearing loss, ear pain, nosebleeds, congestion, sore throat, neck pain, tinnitus and ear discharge.   Eyes: Negative for blurred vision, double vision, photophobia, pain, discharge and redness.  Respiratory: Negative for cough, hemoptysis, sputum production, shortness of breath, wheezing and stridor.   Cardiovascular: Negative for chest pain, palpitations, orthopnea, claudication, leg swelling and PND.  Gastrointestinal: Negative for heartburn, nausea, vomiting, abdominal pain, diarrhea, constipation, blood in stool and melena.  Genitourinary: Negative for dysuria, urgency, frequency,  hematuria and flank pain.  Musculoskeletal: Negative for myalgias, back pain, joint pain and falls.  Skin: Negative for itching and rash.  Neurological: Negative for dizziness, tingling, tremors, sensory change, speech change, focal weakness, seizures, loss of consciousness, weakness and headaches.  Endo/Heme/Allergies: Negative for environmental allergies and polydipsia. Does not bruise/bleed easily.  SUBJECTIVE: Patient states that "she is feeling better"  VITAL SIGNS: Temp:  [98.4 F (36.9 C)-98.6 F (37 C)] 98.6 F (37 C) (08/07 1916) Pulse Rate:  [92-114] 103 (08/07 1900) Resp:  [16-30] 19 (08/07 1900) BP: (100-171)/(65-82) 100/65 (08/07 1900) SpO2:  [97 %-100 %] 100 % (08/07 1900) Weight:  [54.9 kg (121 lb)] 54.9 kg (121 lb) (08/07 1341)  PHYSICAL EXAMINATION: General: middle aged female, in no acute distress Neuro:  Awake,alert and oriented HEENT:  AT,[place],No JVD Cardiovascular:  S1S2,Regular, no m/r/g Lungs:  Clear bilaterally, no wheezes,crackles,rhonchi Abdomen:  Soft,,NT,ND Musculoskeletal: No edema,cyanosis Skin: warm, dry and intact   Recent Labs Lab 11/30/16 1349 11/30/16 1845  NA 128* 134*  K 5.2* 4.6  CL 92* 103  CO2 8* 15*  BUN 23* 19  CREATININE 1.68* 1.08*  GLUCOSE 643* 245*    Recent Labs Lab 11/30/16 1349  HGB 14.1  HCT 42.8  WBC 10.7  PLT 509*   No results found.  ASSESSMENT / PLAN:  DKA Acute Kidney Injury possibly related to dehydration Mild Hyponatremia due to hypovolemia Elevated bilirubin  Current Tobacco abuse  Plan Continue Insulin gtt Continue IVF F/U BMP every 4 hours Replace electrolytes per usual guideline Start d51/2 when Blood glucose <250  Will transition when CO2>20 And AG>12 Tobacco Cessation Councelling Continue Nicotine patch PRN Zofran for nausea   Yuya Vanwingerden,AG-ACNP Pulmonary and [place]   11/30/2016, 7:35 PM

## 2016-11-30 NOTE — Progress Notes (Signed)
[place], NP notified and made aware of patient's blood pressure of 77/48 (58). Told this nurse to recheck bp in 30 mins.

## 2016-11-30 NOTE — Care Management (Signed)
RNCM spoke with PCP office [facility] [phone] and made follow up appointment for 8/15 at 1245PM. Patient has frequent no-shows and cancellations per their record.  I also see a history of no-show with endocrinology Dr. Gabriel Carina and it is noted that patient "has transportation issues".  Patient has Medicaid and may apply for Medicaid transportation which should be shared with patient as she stabilizes.  Note placed in discharge record with number to call for Medicaid assistance with transportation [phone].

## 2016-11-30 NOTE — ED Triage Notes (Signed)
Pt here with c/o left sided abdominal pain x3 months; also with HIGH cbg per EMS. Pt states "sometimes I can't remember if I took my medicine", pt also with decreased appetite x2 days.

## 2016-11-30 NOTE — ED Provider Notes (Signed)
[facility] Emergency Department Provider Note       Time seen: ----------------------------------------- 1:38 PM on 11/30/2016 -----------------------------------------     I have reviewed the triage vital signs and the nursing notes.   HISTORY   Chief Complaint No chief complaint on file.    HPI Catherine Krueger is a 51 y.o. female who presents to the ED for left-sided abdominal pain for the last 3 months. Fingerstick blood sugar was critical high in route by EMS. Patient states sometimes she can't remember sticker medicine or not. She reports decreased appetite with vomiting and inability to keep down even water. Patient reports to significant weight loss recently. Patient states she has not had insulin today.   Past Medical History:  Diagnosis Date  . Cervical cancer (HCC)    remission, dx at age 54  . Depression   . Diabetes mellitus, type II ([place])   . GERD (gastroesophageal reflux disease)   . H. pylori infection 2013  . HLD (hyperlipidemia)   . Hypertension     Patient Active Problem List   Diagnosis Date Noted  . Hypokalemia 02/24/2015  . Hyponatremia 02/24/2015  . Leukocytosis 02/24/2015  . Anemia 02/24/2015  . Obesity 02/24/2015  . Dehydration 02/24/2015  . Tobacco abuse 02/24/2015  . DKA (diabetic ketoacidoses) ([place]) 02/22/2015  . Depression 02/22/2015  . HLD (hyperlipidemia) 02/22/2015  . HTN (hypertension) 02/22/2015  . GERD (gastroesophageal reflux disease) 02/22/2015  . UTI (lower urinary tract infection) 02/22/2015  . AKI (acute kidney injury) ([place]) 02/22/2015    Past Surgical History:  Procedure Laterality Date  . ABDOMINAL HYSTERECTOMY    . CHOLECYSTECTOMY      Allergies Diclofenac sodium and Keflex [cephalexin]  Social History Social History  Substance Use Topics  . Smoking status: Current Every Day Smoker    Packs/day: 1.00    Types: Cigarettes  . Smokeless tobacco: Never Used  . Alcohol use No     Review of Systems Constitutional: Negative for fever. Cardiovascular: Negative for chest pain. Respiratory: Negative for shortness of breath. Gastrointestinal: Positive for abdominal pain, vomiting Genitourinary: Negative for dysuria. Musculoskeletal: Negative for back pain. Skin: Negative for rash. Neurological: Negative for headaches, positive for generalized weakness  All systems negative/normal/unremarkable except as stated in the HPI  ____________________________________________   PHYSICAL EXAM:  VITAL SIGNS: ED Triage Vitals  Enc Vitals Group     BP      Pulse      Resp      Temp      Temp src      SpO2      Weight      Height      Head Circumference      Peak Flow      Pain Score      Pain Loc      Pain Edu?      Excl. in [place]?     Constitutional: Alert and oriented. No distress Eyes: Conjunctivae are normal. Normal extraocular movements. ENT   Head: Normocephalic and atraumatic.   Nose: No congestion/rhinnorhea.   Mouth/Throat: Mucous membranes are moist.   Neck: No stridor. Cardiovascular: Normal rate, regular rhythm. No murmurs, rubs, or gallops. Respiratory: Mild tachypnea, clear breath sounds Gastrointestinal: Soft and nontender. Normal bowel sounds Musculoskeletal: Nontender with normal range of motion in extremities. No lower extremity tenderness nor edema. Neurologic:  Normal speech and language. No gross focal neurologic deficits are appreciated.  Skin:  Skin is warm, dry and intact. No rash  noted. Psychiatric: Mood and affect are normal. Poor insight ____________________________________________  EKG: Interpreted by me.Sinus tachycardia with a rate of 102 bpm, normal PR interval, normal QRS, long QT. Normal axis.  ____________________________________________  ED COURSE:  Pertinent labs & imaging results that were available during my care of the patient were reviewed by me and considered in my medical decision making (see chart  for details). Patient presents for hyperglycemia and likely DKA, we will assess with labs as indicated. She will receive IV fluids as well as receive subcutaneous insulin   Procedures ____________________________________________   LABS (pertinent positives/negatives)  Labs Reviewed  CBC WITH DIFFERENTIAL/PLATELET - Abnormal; Notable for the following:       Result Value   Platelets 509 (*)    Neutro Abs 8.7 (*)    All other components within normal limits  BLOOD GAS, VENOUS - Abnormal; Notable for the following:    pH, Ven 7.07 (*)    pCO2, Ven 22 (*)    Bicarbonate 6.4 (*)    Acid-base deficit 22.1 (*)    All other components within normal limits  GLUCOSE, CAPILLARY - Abnormal; Notable for the following:    Glucose-Capillary >600 (*)    All other components within normal limits  GLUCOSE, CAPILLARY - Abnormal; Notable for the following:    Glucose-Capillary >600 (*)    All other components within normal limits  COMPREHENSIVE METABOLIC PANEL  TROPONIN I  URINALYSIS, COMPLETE (UACMP) WITH MICROSCOPIC  BETA-HYDROXYBUTYRIC ACID  CBG MONITORING, ED   CRITICAL CARE Performed by: Earleen Newport   Total critical care time: 30 minutes  Critical care time was exclusive of separately billable procedures and treating other patients.  Critical care was necessary to treat or prevent imminent or life-threatening deterioration.  Critical care was time spent personally by me on the following activities: development of treatment plan with patient and/or surrogate as well as nursing, discussions with consultants, evaluation of patient's response to treatment, examination of patient, obtaining history from patient or surrogate, ordering and performing treatments and interventions, ordering and review of laboratory studies, ordering and review of radiographic studies, pulse oximetry and re-evaluation of patient's condition.  ____________________________________________  FINAL  ASSESSMENT AND PLAN  DKA  Plan: Patient's labs and imaging were dictated above. Patient had presented for Hyperglycemia and due to her respiratory pattern was suspected to be in DKA. We did start her on fluids and insulin and have ordered an insulin infusion. She does appear to be significantly acidemic and will likely need to be taken care of in the ICU.   Earleen Newport, MD   Note: This note was generated in part or whole with voice recognition software. Voice recognition is usually quite accurate but there are transcription errors that can and very often do occur. I apologize for any typographical errors that were not detected and corrected.     Earleen Newport, MD 11/30/16 [phone]

## 2016-11-30 NOTE — H&P (Addendum)
[facility] [place] NAME: Catherine Krueger    MR#:  [id number]  DATE OF BIRTH:  [birth year]  DATE OF ADMISSION:  11/30/2016  PRIMARY CARE PHYSICIAN: [facility] [place]    REQUESTING/REFERRING PHYSICIAN: Earleen Newport, MD  CHIEF COMPLAINT:   Chief Complaint  Patient presents with  . Hyperglycemia  . Abdominal Pain   Abdominal pain, nausea and vomiting HISTORY OF PRESENT ILLNESS:  Catherine Krueger  is a 51 y.o. female with a known history of DM, HTN, HLD and GERD. She has had left-sided abdominal pain for the last 3 months,  nausea and vomiting for 2 days. Per her sister, she is not compliance to her insulin and sometime run out of medications. She is found PH 7.07, BS 642 and DKA.  PAST MEDICAL HISTORY:   Past Medical History:  Diagnosis Date  . Cervical cancer (HCC)    remission, dx at age 76  . Depression   . Diabetes mellitus, type II ([place])   . GERD (gastroesophageal reflux disease)   . H. pylori infection 2013  . HLD (hyperlipidemia)   . Hypertension     PAST SURGICAL HISTORY:   Past Surgical History:  Procedure Laterality Date  . ABDOMINAL HYSTERECTOMY    . CHOLECYSTECTOMY      SOCIAL HISTORY:   Social History  Substance Use Topics  . Smoking status: Current Every Day Smoker    Packs/day: 1.00    Types: Cigarettes  . Smokeless tobacco: Never Used  . Alcohol use No    FAMILY HISTORY:   Family History  Problem Relation Age of Onset  . Diabetes Mother   . Heart attack Father   . Heart attack Brother   . Diabetes Brother     DRUG ALLERGIES:   Allergies  Allergen Reactions  . Diclofenac Sodium Other (See Comments)  . Keflex [Cephalexin] Palpitations    REVIEW OF SYSTEMS:   Review of Systems  Constitutional: Positive for malaise/fatigue. Negative for chills and fever.  HENT: Negative for sore throat.   Eyes: Negative for blurred vision and double vision.  Respiratory:  Negative for cough, hemoptysis, shortness of breath, wheezing and stridor.   Cardiovascular: Negative for chest pain, palpitations, orthopnea and leg swelling.  Gastrointestinal: Positive for abdominal pain, nausea and vomiting. Negative for blood in stool, diarrhea and melena.  Genitourinary: Negative for dysuria, flank pain and hematuria.  Musculoskeletal: Negative for back pain and joint pain.  Neurological: Negative for dizziness, sensory change, focal weakness, seizures, loss of consciousness, weakness and headaches.  Endo/Heme/Allergies: Negative for polydipsia.  Psychiatric/Behavioral: Negative for depression. The patient is not nervous/anxious.     MEDICATIONS AT HOME:   Prior to Admission medications   Medication Sig Start Date End Date Taking? Authorizing Provider  atorvastatin (LIPITOR) 40 MG tablet Take 40 mg by mouth daily.   Yes [provider]  insulin aspart (NOVOLOG) 100 UNIT/ML injection Inject 0-24 Units into the skin every 4 (four) hours. 02/24/15  Yes Theodoro Grist, MD  insulin glargine (LANTUS) 100 UNIT/ML injection Inject 1 mL (100 Units total) into the skin daily. Patient taking differently: Inject 80 Units into the skin daily.  02/24/15  Yes Theodoro Grist, MD  lisinopril (PRINIVIL,ZESTRIL) 40 MG tablet Take 40 mg by mouth daily.   Yes [provider]  metFORMIN (GLUCOPHAGE) 1000 MG tablet Take 1,000 mg by mouth 2 (two) times daily with a meal.   Yes [provider]  ondansetron (  ZOFRAN) 4 MG tablet Take 4 mg by mouth 3 (three) times daily as needed for nausea. 11/04/16  Yes [provider]  sucralfate (CARAFATE) 1 G tablet Take 1 g by mouth 2 (two) times daily.   Yes [provider]  nicotine (NICODERM CQ - DOSED IN MG/24 HOURS) 21 mg/24hr patch Place 1 patch (21 mg total) onto the skin daily. Patient not taking: Reported on 11/30/2016 02/24/15   Theodoro Grist, MD  nicotine polacrilex (NICORETTE) 2 MG gum Take 1 each (2 mg  total) by mouth as needed for smoking cessation. Patient not taking: Reported on 11/30/2016 02/24/15   Theodoro Grist, MD  traMADol (ULTRAM) 50 MG tablet Take 1 tablet (50 mg total) by mouth every 6 (six) hours as needed. Patient not taking: Reported on 11/30/2016 08/20/16 08/20/17  Earleen Newport, MD      VITAL SIGNS:  Blood pressure (!) 144/75, pulse (!) 101, temperature 98.6 F (37 C), temperature source Oral, resp. rate (!) 30, height 5\' 3"  (1.6 m), weight 121 lb (54.9 kg), SpO2 100 %.  PHYSICAL EXAMINATION:  Physical Exam  GENERAL:  51 y.o.-year-old patient lying in the bed with no acute distress.  EYES: Pupils equal, round, reactive to light and accommodation. No scleral icterus. Extraocular muscles intact.  HEENT: Head atraumatic, normocephalic. Oropharynx and nasopharynx clear.  NECK:  Supple, no jugular venous distention. No thyroid enlargement, no tenderness.  LUNGS: Normal breath sounds bilaterally, no wheezing, rales,rhonchi or crepitation. No use of accessory muscles of respiration.  CARDIOVASCULAR: S1, S2 normal. No murmurs, rubs, or gallops.  ABDOMEN: Soft, nontender, nondistended. Bowel sounds present. No organomegaly or mass.  EXTREMITIES: No pedal edema, cyanosis, or clubbing.  NEUROLOGIC: Cranial nerves II through XII are intact. Muscle strength 5/5 in all extremities. Sensation intact. Gait not checked.  PSYCHIATRIC: The patient is alert and oriented x 3.  SKIN: No obvious rash, lesion, or ulcer.   LABORATORY PANEL:   CBC  Recent Labs Lab 11/30/16 1349  WBC 10.7  HGB 14.1  HCT 42.8  PLT 509*   ------------------------------------------------------------------------------------------------------------------  Chemistries   Recent Labs Lab 11/30/16 1349  NA 128*  K PENDING  CL 92*  CO2 8*  GLUCOSE 643*  BUN 23*  CREATININE 1.68*  CALCIUM 9.9  AST 16  ALT 10*  ALKPHOS 90  BILITOT 2.0*    ------------------------------------------------------------------------------------------------------------------  Cardiac Enzymes  Recent Labs Lab 11/30/16 1349  TROPONINI <0.03   ------------------------------------------------------------------------------------------------------------------  RADIOLOGY:  No results found.    IMPRESSION AND PLAN:   DKA, DM. Start insulin drip, DKA protocol, IVF and follow up BMP Q4H. Pain control, Zofran prn.  ARF. Continue IVF and follow up BMP. Pseudohyponatremia. IVF and f/u Na. Elevated bilirubin, due to nausea and vomiting. F/u level. HTN. Continue HTN medication.   Tobacco abuse. Smoking cessation was counseled for 3-4 minutes, Nicoderm.  All the records are reviewed and case discussed with ED provider. Management plans discussed with the patient, sister and they are in agreement.  CODE STATUS: full code.  TOTAL CRITICAL TIME TAKING CARE OF THIS PATIENT: 58 minutes.    Demetrios Loll M.D on 11/30/2016 at 2:52 PM  Between 7am to 6pm - Pager - [phone]  After 6pm go to [email or web address] - [employer] [facility]  Office  [phone]  CC: Primary care physician; [facility] [place]   Note: This dictation was prepared with Diplomatic Services operational officer dictation along with smaller phrase technology. Any transcriptional errors that result from this  process are unintentional.

## 2016-11-30 NOTE — Progress Notes (Signed)
New blood pressure taken 76/51 (59) at 2218. NP made aware. Order to start 515ml of fluid bolus.

## 2016-11-30 NOTE — Progress Notes (Signed)
eLink Physician-Brief Progress Note Patient Name: Catherine Krueger DOB: 01/20/1966 MRN: [id number]   Date of Service  11/30/2016  HPI/Events of Note  51 yo female with DM, now admitted with DKA, AKI. Stable, awake on cam check.   eICU Interventions  Continue protocol. Continue home anti-HTN med.         Laverle Hobby 11/30/2016, 4:29 PM

## 2016-12-01 LAB — BASIC METABOLIC PANEL
ANION GAP: 7 (ref 5–15)
ANION GAP: 7 (ref 5–15)
BUN: 18 mg/dL (ref 6–20)
BUN: 17 mg/dL (ref 6–20)
CALCIUM: 8.8 mg/dL — AB (ref 8.9–10.3)
CALCIUM: 8 mg/dL — AB (ref 8.9–10.3)
CO2: 23 mmol/L (ref 22–32)
CO2: 18 mmol/L — AB (ref 22–32)
CREATININE: 0.82 mg/dL (ref 0.44–1.00)
Chloride: 103 mmol/L (ref 101–111)
Chloride: 107 mmol/L (ref 101–111)
Creatinine, Ser: 0.77 mg/dL (ref 0.44–1.00)
GFR calc Af Amer: >60 mL/min (ref >60)
GFR calc Af Amer: >60 mL/min (ref >60)
GFR calc non Af Amer: >60 mL/min (ref >60)
GLUCOSE: 238 mg/dL — AB (ref 65–99)
Glucose, Bld: 160 mg/dL — ABNORMAL HIGH (ref 65–99)
Potassium: 3.4 mmol/L — ABNORMAL LOW (ref 3.5–5.1)
Potassium: 3.7 mmol/L (ref 3.5–5.1)
Sodium: 133 mmol/L — ABNORMAL LOW (ref 135–145)
Sodium: 132 mmol/L — ABNORMAL LOW (ref 135–145)

## 2016-12-01 LAB — CBC
HEMATOCRIT: 35.3 % (ref 35.0–47.0)
Hemoglobin: 12.4 g/dL (ref 12.0–16.0)
MCH: 30.4 pg (ref 26.0–34.0)
MCHC: 35.1 g/dL (ref 32.0–36.0)
MCV: 86.5 fL (ref 80.0–100.0)
Platelets: 422 10*3/uL (ref 150–440)
RBC: 4.09 MIL/uL (ref 3.80–5.20)
RDW: 13.4 % (ref 11.5–14.5)
WBC: 12.3 10*3/uL — AB (ref 3.6–11.0)

## 2016-12-01 LAB — GLUCOSE, CAPILLARY
GLUCOSE-CAPILLARY: 379 mg/dL — AB (ref 65–99)
GLUCOSE-CAPILLARY: 148 mg/dL — AB (ref 65–99)
GLUCOSE-CAPILLARY: 253 mg/dL — AB (ref 65–99)
GLUCOSE-CAPILLARY: 201 mg/dL — AB (ref 65–99)
GLUCOSE-CAPILLARY: 226 mg/dL — AB (ref 65–99)
Glucose-Capillary: 168 mg/dL — ABNORMAL HIGH (ref 65–99)
Glucose-Capillary: 277 mg/dL — ABNORMAL HIGH (ref 65–99)

## 2016-12-01 LAB — HIV ANTIBODY (ROUTINE TESTING W REFLEX): HIV Screen 4th Generation wRfx: NONREACTIVE

## 2016-12-01 LAB — HEMOGLOBIN A1C

## 2016-12-01 MED ORDER — INSULIN ASPART 100 UNIT/ML ~~LOC~~ SOLN: 0.0000 [IU] | Freq: Every day | SUBCUTANEOUS | Status: DC

## 2016-12-01 MED ORDER — INSULIN ASPART 100 UNIT/ML ~~LOC~~ SOLN
0.0000 [IU] | Freq: Three times a day (TID) | SUBCUTANEOUS | Status: DC
  Administered 2016-12-02: 1 [IU] via SUBCUTANEOUS
  Administered 2016-12-03: 08:00:00 2 [IU] via SUBCUTANEOUS
  Administered 2016-12-03: 13:00:00 3 [IU] via SUBCUTANEOUS
  Filled 2016-12-01 (×3): qty 1

## 2016-12-01 MED ORDER — INSULIN ASPART 100 UNIT/ML ~~LOC~~ SOLN
4.0000 [IU] | Freq: Three times a day (TID) | SUBCUTANEOUS | Status: DC
  Administered 2016-12-02 – 2016-12-03 (×3): 4 [IU] via SUBCUTANEOUS
  Filled 2016-12-01 (×3): qty 1

## 2016-12-01 MED ORDER — SODIUM CHLORIDE 0.9 % IV SOLN
INTRAVENOUS | Status: DC
  Administered 2016-12-01 (×2): via INTRAVENOUS

## 2016-12-01 MED ORDER — SODIUM CHLORIDE 0.9 % IV BOLUS (SEPSIS)
500.0000 mL | Freq: Once | INTRAVENOUS | Status: AC
  Administered 2016-12-01: 500 mL via INTRAVENOUS

## 2016-12-01 NOTE — Progress Notes (Signed)
Spoke to Dana, NP regarding patient's blood pressure. NP is okay with a map of 60 since patient is asymptomatic. Order to start normal saline at 75.

## 2016-12-01 NOTE — Care Management Note (Signed)
Case Management Note  Patient Details  Name: Catherine Krueger MRN: [id number] Date of Birth: August 10, 1965  Subjective/Objective:                  RNCM met with patient to discuss discharge planning and medical compliance she relates to lack of transportation.  She states she lives alone however she plans to "move next door with her sister". She states she does not drive and relates no-shows to outside providers to not having transportation. She states she has not applied through Medicaid for transportation services and indicated that she was not aware of these services.  She is independent with mobility but primarily homebound. Patient may have difficulty understanding diabetes as she mentioned problems with meal preparation and request "something like Ensure like her diabetic mother uses".  She is not on O2 at home. She has a working glucometer however she requests another one- which cannot be provided if this one works explained to patient.  She states she can access her medications however she has history of not using her medications- she "got them this month" per patient.   Action/Plan: This RNCM provided patient with contact for Medicaid transportation application [phone]; [employer] and [place]. I stressed the importance of applying but she refused to do it while I was there due to "wanting to sleep".  She would like to have a home health nurse come out to help her with meal planning, scheduling appointment/calendar organization, and diabetes education. She has no preference of agencies- [facility] and they agree to accept patient. She requests "Ensure" at discharge however I spoke with nurse and she will address. Patient has good appetite here per nurse.   Expected Discharge Date:                  Expected Discharge Plan:     In-House Referral:     Discharge planning Services  CM Consult, Follow-up appt scheduled, Other - See comment (Medicaid transportation)  Post Acute  Care Choice:    Choice offered to:  Patient  DME Arranged:    DME Agency:     HH Arranged:    [place] Agency:     Status of Service:  In process, will continue to follow  If discussed at Long Length of Stay Meetings, dates discussed:    Additional Comments:  Marshell Garfinkel, RN 12/01/2016, 10:52 AM

## 2016-12-01 NOTE — Progress Notes (Signed)
Report given to [facility]. Patient will be transferred to room 112 via wheelchair. Patient stable and VSS.

## 2016-12-01 NOTE — Progress Notes (Signed)
Inpatient Diabetes Program Recommendations  AACE/ADA: New Consensus Statement on Inpatient Glycemic Control (2015)  Target Ranges:  Prepandial:   less than 140 mg/dL      Peak postprandial:   less than 180 mg/dL (1-2 hours)      Critically ill patients:  140 - 180 mg/dL  Results for Catherine Krueger, Catherine Krueger (MRN [id number]) as of 12/01/2016 13:20  Ref. Range 11/30/2016 20:32 11/30/2016 21:36 11/30/2016 22:32 11/30/2016 23:31 12/01/2016 00:35 12/01/2016 01:29 12/01/2016 07:13 12/01/2016 11:22  Glucose-Capillary Latest Ref Range: 65 - 99 mg/dL 180 (H) 140 (H) 142 (H) 147 (H) 168 (H) 148 (H) 253 (H) 201 (H)  Results for Catherine Krueger, Catherine Krueger (MRN [id number]) as of 12/01/2016 13:20  Ref. Range 11/30/2016 13:49  Glucose Latest Ref Range: 65 - 99 mg/dL [facility])   Results for Catherine Krueger, Catherine Krueger (MRN [id number]) as of 12/01/2016 13:20  Ref. Range 11/30/2016 13:49  Hemoglobin A1C Latest Ref Range: 4.8 - 5.6 % >15.5 (H)   Review of Glycemic Control  Outpatient Diabetes medications: Lantus 80 units QHS, Novolog 25 units QID, Metformin 1000 mg BID (per patient, has not taken any DM meds in over 2 months) Current orders for Inpatient glycemic control: Lantus 40 units BID, Novolog 0-15 units TID with meals  Inpatient Diabetes Program Recommendations: HgbA1C: A1C >15.5% on 11/30/16 which indicates an average glucose greater than 398 mg/dl over the past 2-3 months. Insulin-Correction: Please consider ordering Novolog 0-5 units QHS for bedtime correction scale. Insulin - Meal Coverage: If post prandial glucose continues to be > 180 mg/dl, please consider ordering Novolog 4 units TID with meals for meal coverage if patient eats at least 50% of meals.  NOTE:. With patient's permission, spoke with patient, her sister, and a friend Public librarian) about diabetes and home regimen for diabetes control. Patient reports that she was being followed by PCP for diabetes management but she admits that she has not seen PCP in several months. Patient's PCP made a  referral for patient to Dr. Gabriel Carina (Endocrinologist) in July but she missed her appointment and was rescheduled with a new Endocrinologist in the office for October. Patient reports that she has not taken any DM medications in over 2 months and she reports that she does not have any insulin at home right now. Patient was using insulin pens. Inquired about why she hasn't taken medications in over 2 months. Patient reports that she "just stopped" and her sister stated that "My sister has been under a lot of stress and her daughter has been leaving her kids with Catherine Krueger. Catherine Krueger has not been doing anything for herself." Inquired about any financial barriers to getting medications and patient stated that she was able to afford her medications without any problems (reports medications are usually less than $1.50 since she has Medicaid and Medicare).  Patient has not been checking glucose at home in several months. Patient states that she has a glucometer and test strips but needs lancets. Discussed glucose and A1C goals. At the time of talking with patient, informed her that her A1C was in process. Explained that it was likely to be very elevated since she has not taken her insulin in over 2 months.  Discussed importance of checking CBGs and maintaining good CBG control to prevent long-term and short-term complications. Explained how hyperglycemia leads to damage within blood vessels which lead to the common complications seen with uncontrolled diabetes. Stressed to the patient the importance of improving glycemic control to prevent further complications from uncontrolled diabetes. Discussed  impact of nutrition, exercise, stress, sickness, and medications on diabetes control. Informed patient she should be getting a Living Well with Diabetes booklet. Encouraged patient to read the entire book. Patient's sister stated that she felt that Catherine Krueger needed outpatient DM education and follow up. Will place order for outpatient DM  education (will need MD to cosign). Encouraged patient to check her glucose 4 times per day (before meals and at bedtime), to keep a log book of glucose readings and insulin taken which he will need to take to doctor appointments, and to take DM medications as prescribed. Explained how her doctor can use the log book to continue to make insulin adjustments if needed. Patient verbalized understanding of information discussed and she states that she has no further questions at this time related to diabetes.  Thanks, Barnie Alderman, RN, MSN, CDE Diabetes Coordinator Inpatient Diabetes Program [phone] (Team Pager)

## 2016-12-01 NOTE — Progress Notes (Signed)
[place] [facility] [facility] [place] NAME: Catherine Krueger    MR#:  [id number]  DATE OF BIRTH:  Sep 11, 1965  SUBJECTIVE:  CHIEF COMPLAINT:   Chief Complaint  Patient presents with  . Hyperglycemia  . Abdominal Pain  feeling better REVIEW OF SYSTEMS:  Review of Systems  Constitutional: Negative for chills, fever and weight loss.  HENT: Negative for nosebleeds and sore throat.   Eyes: Negative for blurred vision.  Respiratory: Negative for cough, shortness of breath and wheezing.   Cardiovascular: Negative for chest pain, orthopnea, leg swelling and PND.  Gastrointestinal: Negative for abdominal pain, constipation, diarrhea, heartburn, nausea and vomiting.  Genitourinary: Negative for dysuria and urgency.  Musculoskeletal: Negative for back pain.  Skin: Negative for rash.  Neurological: Negative for dizziness, speech change, focal weakness and headaches.  Endo/Heme/Allergies: Does not bruise/bleed easily.  Psychiatric/Behavioral: Negative for depression.    DRUG ALLERGIES:   Allergies  Allergen Reactions  . Diclofenac Sodium Other (See Comments)  . Keflex [Cephalexin] Palpitations   VITALS:  Blood pressure 115/66, pulse 91, temperature 98.7 F (37.1 C), temperature source Oral, resp. rate 14, height 5\' 3"  (1.6 m), weight 54.9 kg (121 lb), SpO2 99 %. PHYSICAL EXAMINATION:  Physical Exam  Constitutional: She is oriented to person, place, and time and well-developed, well-nourished, and in no distress.  HENT:  Head: Normocephalic and atraumatic.  Eyes: Pupils are equal, round, and reactive to light. Conjunctivae and EOM are normal.  Neck: Normal range of motion. Neck supple. No tracheal deviation present. No thyromegaly present.  Cardiovascular: Normal rate, regular rhythm and normal heart sounds.   Pulmonary/Chest: Effort normal and breath sounds normal. No respiratory distress. She has no wheezes. She exhibits no tenderness.  Abdominal: Soft.  Bowel sounds are normal. She exhibits no distension. There is no tenderness.  Musculoskeletal: Normal range of motion.  Neurological: She is alert and oriented to person, place, and time. No cranial nerve deficit.  Skin: Skin is warm and dry. No rash noted.  Psychiatric: Mood and affect normal.   LABORATORY PANEL:  Female CBC  Recent Labs Lab 12/01/16 0204  WBC 12.3*  HGB 12.4  HCT 35.3  PLT 422   ------------------------------------------------------------------------------------------------------------------ Chemistries   Recent Labs Lab 11/30/16 1349  12/01/16 0603  NA 128*  < > 132*  K 5.2*  < > 3.7  CL 92*  < > 107  CO2 8*  < > 18*  GLUCOSE 643*  < > 238*  BUN 23*  < > 17  CREATININE 1.68*  < > 0.77  CALCIUM 9.9  < > 8.0*  AST 16  --   --   ALT 10*  --   --   ALKPHOS 90  --   --   BILITOT 2.0*  --   --   < > = values in this interval not displayed. RADIOLOGY:  No results found. ASSESSMENT AND PLAN:  51 y.o. female with a known history of DM, HTN, HLD and GERD. She has had left-sided abdominal pain for the last 3 months,  nausea and vomiting for 2 days. Per her sister, she is not compliance to her insulin and sometime run out of medications  * DKA - resolved. - due to meds noncomplaince  * ARF. Resolved with hydration  * Pseudohyponatremia: resolved  * Elevated bilirubin, due to nausea and vomiting. F/u level in am  * HTN. Continue continue lisinopril     All the records are reviewed and  case discussed with Care Management/Social Worker. Management plans discussed with the patient, nursing and they are in agreement.  CODE STATUS: Full Code  TOTAL TIME TAKING CARE OF THIS PATIENT: 35 minutes.   More than 50% of the time was spent in counseling/coordination of care: YES  POSSIBLE D/C IN 1-2 DAYS, DEPENDING ON CLINICAL CONDITION.   Max Sane M.D on 12/01/2016 at 5:17 PM  Between 7am to 6pm - Pager - [phone]  After 6pm go to [email or web address] -  [employer] [facility]  Office  [phone]  CC: Primary care physician; [facility] [place]  Note: This dictation was prepared with Diplomatic Services operational officer dictation along with smaller phrase technology. Any transcriptional errors that result from this process are unintentional.

## 2016-12-02 ENCOUNTER — Inpatient Hospital Stay: Payer: Medicare Other

## 2016-12-02 ENCOUNTER — Encounter: Payer: Self-pay | Admitting: Radiology

## 2016-12-02 LAB — LIPASE, BLOOD: LIPASE: 69 U/L — AB (ref 11–51)

## 2016-12-02 LAB — CBC
HCT: 33 % — ABNORMAL LOW (ref 35.0–47.0)
Hemoglobin: 11.5 g/dL — ABNORMAL LOW (ref 12.0–16.0)
MCH: 30.4 pg (ref 26.0–34.0)
MCHC: 34.8 g/dL (ref 32.0–36.0)
MCV: 87.4 fL (ref 80.0–100.0)
Platelets: 295 10*3/uL (ref 150–440)
RBC: 3.78 MIL/uL — AB (ref 3.80–5.20)
RDW: 13.6 % (ref 11.5–14.5)
WBC: 9.2 10*3/uL (ref 3.6–11.0)

## 2016-12-02 LAB — COMPREHENSIVE METABOLIC PANEL
ALT: 7 U/L — AB (ref 14–54)
AST: 9 U/L — AB (ref 15–41)
Albumin: 2.8 g/dL — ABNORMAL LOW (ref 3.5–5.0)
Alkaline Phosphatase: 59 U/L (ref 38–126)
Anion gap: 6 (ref 5–15)
BUN: 10 mg/dL (ref 6–20)
CO2: 21 mmol/L — AB (ref 22–32)
CREATININE: 0.52 mg/dL (ref 0.44–1.00)
Calcium: 8.7 mg/dL — ABNORMAL LOW (ref 8.9–10.3)
Chloride: 108 mmol/L (ref 101–111)
GFR calc Af Amer: >60 mL/min (ref >60)
GFR calc non Af Amer: >60 mL/min (ref >60)
Glucose, Bld: 233 mg/dL — ABNORMAL HIGH (ref 65–99)
POTASSIUM: 2.8 mmol/L — AB (ref 3.5–5.1)
SODIUM: 135 mmol/L (ref 135–145)
Total Bilirubin: 0.6 mg/dL (ref 0.3–1.2)
Total Protein: 5.4 g/dL — ABNORMAL LOW (ref 6.5–8.1)

## 2016-12-02 LAB — GLUCOSE, CAPILLARY
GLUCOSE-CAPILLARY: 124 mg/dL — AB (ref 65–99)
GLUCOSE-CAPILLARY: 68 mg/dL (ref 65–99)
GLUCOSE-CAPILLARY: 179 mg/dL — AB (ref 65–99)
Glucose-Capillary: 41 mg/dL — CL (ref 65–99)
Glucose-Capillary: 105 mg/dL — ABNORMAL HIGH (ref 65–99)
Glucose-Capillary: 117 mg/dL — ABNORMAL HIGH (ref 65–99)

## 2016-12-02 LAB — POTASSIUM: POTASSIUM: 3.4 mmol/L — AB (ref 3.5–5.1)

## 2016-12-02 LAB — MAGNESIUM: Magnesium: 1.4 mg/dL — ABNORMAL LOW (ref 1.7–2.4)

## 2016-12-02 MED ORDER — POTASSIUM CHLORIDE CRYS ER 20 MEQ PO TBCR
40.0000 meq | EXTENDED_RELEASE_TABLET | Freq: Once | ORAL | Status: AC
  Administered 2016-12-02: 15:00:00 40 meq via ORAL
  Filled 2016-12-02: qty 2

## 2016-12-02 MED ORDER — IOPAMIDOL (ISOVUE-300) INJECTION 61%
15.0000 mL | INTRAVENOUS | Status: AC
  Administered 2016-12-02 (×2): 15 mL via ORAL

## 2016-12-02 MED ORDER — POTASSIUM CHLORIDE CRYS ER 20 MEQ PO TBCR
40.0000 meq | EXTENDED_RELEASE_TABLET | ORAL | Status: AC
  Administered 2016-12-02 (×2): 40 meq via ORAL
  Filled 2016-12-02 (×2): qty 2

## 2016-12-02 MED ORDER — IOPAMIDOL (ISOVUE-300) INJECTION 61%
100.0000 mL | Freq: Once | INTRAVENOUS | Status: AC | PRN
  Administered 2016-12-02: 15:00:00 100 mL via INTRAVENOUS

## 2016-12-02 NOTE — Discharge Summary (Deleted)
[facility] [place] NAME: Catherine Krueger    MR#:  [id number]  DATE OF BIRTH:  08-01-1965  DATE OF ADMISSION:  11/30/2016   ADMITTING PHYSICIAN: Demetrios Loll, MD  DATE OF DISCHARGE: 12/02/2016  PRIMARY CARE PHYSICIAN: [facility], [place]   ADMISSION DIAGNOSIS:  Diabetic ketoacidosis without coma associated with type 1 diabetes mellitus ([place]) [E10.10] DKA (diabetic ketoacidoses) ([place]) [E13.10] DISCHARGE DIAGNOSIS:  Active Problems:   DKA (diabetic ketoacidoses) ([place])  SECONDARY DIAGNOSIS:   Past Medical History:  Diagnosis Date  . Cervical cancer (HCC)    remission, dx at age 64  . Depression   . Diabetes mellitus, type II ([place])   . GERD (gastroesophageal reflux disease)   . H. pylori infection 2013  . HLD (hyperlipidemia)   . Hypertension    HOSPITAL COURSE:  51 y.o.femalewith a known history of DM, HTN, HLD and GERD. She has had left-sided abdominal pain for the last 3 months, nausea and vomiting for 2 days. Per her sister, she is not compliance to her insulin and sometime run out of medications  * DKA - resolved. - due to meds noncomplaince - now having hypoglycemia  * Pancreatic duct stone: seen on CT will need ERCP, d/w Dr Allen Norris who recommends transfer to [facility]  * ARF. Resolved with hydration  * Pseudohyponatremia: resolved  * Elevated bilirubin, due to nausea and vomiting. F/u level in am  * HTN. Continue continue lisinopril  DISCHARGE CONDITIONS:  stable CONSULTS OBTAINED:   DRUG ALLERGIES:   Allergies  Allergen Reactions  . Diclofenac Sodium Other (See Comments)  . Keflex [Cephalexin] Palpitations   DISCHARGE MEDICATIONS:   Allergies as of 12/02/2016      Reactions   Diclofenac Sodium Other (See Comments)   Keflex [cephalexin] Palpitations      Medication List    TAKE these medications   atorvastatin 40 MG tablet Commonly known as:  LIPITOR Take 40 mg by mouth daily.   insulin aspart 100 UNIT/ML injection Commonly known as:  novoLOG Inject 0-24 Units into the skin every 4 (four) hours.   insulin glargine 100 UNIT/ML injection Commonly known as:  LANTUS Inject 1 mL (100 Units total) into the skin daily. What changed:  how much to take   lisinopril 40 MG tablet Commonly known as:  PRINIVIL,ZESTRIL Take 40 mg by mouth daily.   metFORMIN 1000 MG tablet Commonly known as:  GLUCOPHAGE Take 1,000 mg by mouth 2 (two) times daily with a meal.   nicotine 21 mg/24hr patch Commonly known as:  NICODERM CQ - dosed in mg/24 hours Place 1 patch (21 mg total) onto the skin daily.   nicotine polacrilex 2 MG gum Commonly known as:  NICORETTE Take 1 each (2 mg total) by mouth as needed for smoking cessation.   ondansetron 4 MG tablet Commonly known as:  ZOFRAN Take 4 mg by mouth 3 (three) times daily as needed for nausea.   sucralfate 1 g tablet Commonly known as:  CARAFATE Take 1 g by mouth 2 (two) times daily.   traMADol 50 MG tablet Commonly known as:  ULTRAM Take 1 tablet (50 mg total) by mouth every 6 (six) hours as needed.        DISCHARGE INSTRUCTIONS:   DIET:  Low fat, Low cholesterol diet DISCHARGE CONDITION:  Good ACTIVITY:  Activity as tolerated OXYGEN:  Home Oxygen: No.  Oxygen Delivery: room air DISCHARGE LOCATION:  [facility]   If you  experience worsening of your admission symptoms, develop shortness of breath, life threatening emergency, suicidal or homicidal thoughts you must seek medical attention immediately by calling 911 or calling your MD immediately  if symptoms less severe.  You Must read complete instructions/literature along with all the possible adverse reactions/side effects for all the Medicines you take and that have been prescribed to you. Take any new Medicines after you have completely understood and accpet all the possible adverse reactions/side effects.   Please note  You were cared for by a hospitalist  during your hospital stay. If you have any questions about your discharge medications or the care you received while you were in the hospital after you are discharged, you can call the unit and asked to speak with the hospitalist on call if the hospitalist that took care of you is not available. Once you are discharged, your primary care physician will handle any further medical issues. Please note that NO REFILLS for any discharge medications will be authorized once you are discharged, as it is imperative that you return to your primary care physician (or establish a relationship with a primary care physician if you do not have one) for your aftercare needs so that they can reassess your need for medications and monitor your lab values.    On the day of Discharge:  VITAL SIGNS:  Blood pressure (!) 108/57, pulse 85, temperature 97.9 F (36.6 C), temperature source Oral, resp. rate 18, height 5\' 3"  (1.6 m), weight 54.9 kg (121 lb), SpO2 100 %. PHYSICAL EXAMINATION:  GENERAL:  51 y.o.-year-old patient lying in the bed with no acute distress.  EYES: Pupils equal, round, reactive to light and accommodation. No scleral icterus. Extraocular muscles intact.  HEENT: Head atraumatic, normocephalic. Oropharynx and nasopharynx clear.  NECK:  Supple, no jugular venous distention. No thyroid enlargement, no tenderness.  LUNGS: Normal breath sounds bilaterally, no wheezing, rales,rhonchi or crepitation. No use of accessory muscles of respiration.  CARDIOVASCULAR: S1, S2 normal. No murmurs, rubs, or gallops.  ABDOMEN: Soft, non-tender, non-distended. Bowel sounds present. No organomegaly or mass.  EXTREMITIES: No pedal edema, cyanosis, or clubbing.  NEUROLOGIC: Cranial nerves II through XII are intact. Muscle strength 5/5 in all extremities. Sensation intact. Gait not checked.  PSYCHIATRIC: The patient is alert and oriented x 3.  SKIN: No obvious rash, lesion, or ulcer.  DATA REVIEW:   CBC  Recent Labs Lab  12/02/16 0324  WBC 9.2  HGB 11.5*  HCT 33.0*  PLT 295    Chemistries   Recent Labs Lab 12/02/16 0324 12/02/16 1028  NA 135  --   K 2.8* 3.4*  CL 108  --   CO2 21*  --   GLUCOSE 233*  --   BUN 10  --   CREATININE 0.52  --   CALCIUM 8.7*  --   MG 1.4*  --   AST 9*  --   ALT 7*  --   ALKPHOS 59  --   BILITOT 0.6  --      Microbiology Results  Results for orders placed or performed during the hospital encounter of 11/30/16  MRSA PCR Screening     Status: None   Collection Time: 11/30/16  4:25 PM  Result Value Ref Range Status   MRSA by PCR NEGATIVE NEGATIVE Final    Comment:        The GeneXpert MRSA Assay (FDA approved for NASAL specimens only), is one component of a comprehensive MRSA colonization surveillance program. It  is not intended to diagnose MRSA infection nor to guide or monitor treatment for MRSA infections.     RADIOLOGY:  Ct Abdomen Pelvis W Contrast  Result Date: 12/02/2016 CLINICAL DATA:  Abdominal pain EXAM: CT ABDOMEN AND PELVIS WITH CONTRAST TECHNIQUE: Multidetector CT imaging of the abdomen and pelvis was performed using the standard protocol following bolus administration of intravenous contrast. CONTRAST:  166mL ISOVUE-300 IOPAMIDOL (ISOVUE-300) INJECTION 61% COMPARISON:  None. FINDINGS: Lower chest:  No contributory findings. Hepatobiliary: No focal liver abnormality.Cholecystectomy. No common bile duct dilatation or visible stone. Pancreas: The body and tail of the pancreas is expanded and indistinct. Regional retroperitoneal edema. There are 2 calcifications in line with the main pancreatic duct, which is more proximally dilated. The largest stone measures 5 mm at the level of the body/neck junction. Spleen: Unremarkable. Adrenals/Urinary Tract: Negative adrenals. Simple appearing 15 mm right lower pole cyst. Neighboring streaky density on delayed phase has subtle volume loss and is likely scarring. No hydronephrosis. Prominent distention of the  urinary bladder without definite acute superimposed finding. Stomach/Bowel:  No obstruction. No appendicitis. Vascular/Lymphatic: No acute vascular abnormality. The splenic vein is patent. Mild atheromatous wall thickening of the aorta. Calcifications of the iliac branches. No mass or adenopathy. Reproductive:Hysterectomy and possible oophorectomies. Other: Small volume pelvic ascites, likely reactive. Musculoskeletal: No acute abnormalities. IMPRESSION: Acute pancreatitis without necrosis or fluid collection. There is a 5 mm stone in the main pancreatic duct at the level of the body/neck with proximal duct dilatation. Cholecystectomy. Electronically Signed   By: Monte Fantasia M.D.   On: 12/02/2016 14:55     Management plans discussed with the patient, family and they are in agreement.  CODE STATUS: Full Code   TOTAL TIME TAKING CARE OF THIS PATIENT: 45 minutes.    Max Sane M.D on 12/02/2016 at 3:40 PM  Between 7am to 6pm - Pager - [phone]  After 6pm go to [email or web address] - Technical brewer [place] [facility]  Office  [phone]  CC: Primary care physician; [facility], [place]   Note: This dictation was prepared with Diplomatic Services operational officer dictation along with smaller phrase technology. Any transcriptional errors that result from this process are unintentional.

## 2016-12-02 NOTE — Progress Notes (Signed)
[place] [facility] [place] NAME: Catherine Krueger    MR#:  [id number]  DATE OF BIRTH:  March 12, 1966  SUBJECTIVE:  CHIEF COMPLAINT:   Chief Complaint  Patient presents with  . Hyperglycemia  . Abdominal Pain  blood sugar dropped, having abd pain REVIEW OF SYSTEMS:  Review of Systems  Constitutional: Negative for chills, fever and weight loss.  HENT: Negative for nosebleeds and sore throat.   Eyes: Negative for blurred vision.  Respiratory: Negative for cough, shortness of breath and wheezing.   Cardiovascular: Negative for chest pain, orthopnea, leg swelling and PND.  Gastrointestinal: Positive for abdominal pain. Negative for constipation, diarrhea, heartburn, nausea and vomiting.  Genitourinary: Negative for dysuria and urgency.  Musculoskeletal: Negative for back pain.  Skin: Negative for rash.  Neurological: Negative for dizziness, speech change, focal weakness and headaches.  Endo/Heme/Allergies: Does not bruise/bleed easily.  Psychiatric/Behavioral: Negative for depression.   DRUG ALLERGIES:   Allergies  Allergen Reactions  . Diclofenac Sodium Other (See Comments)  . Keflex [Cephalexin] Palpitations   VITALS:  Blood pressure (!) 108/57, pulse 85, temperature 97.9 F (36.6 C), temperature source Oral, resp. rate 18, height 5\' 3"  (1.6 m), weight 54.9 kg (121 lb), SpO2 100 %. PHYSICAL EXAMINATION:  Physical Exam  Constitutional: She is oriented to person, place, and time and well-developed, well-nourished, and in no distress.  HENT:  Head: Normocephalic and atraumatic.  Eyes: Pupils are equal, round, and reactive to light. Conjunctivae and EOM are normal.  Neck: Normal range of motion. Neck supple. No tracheal deviation present. No thyromegaly present.  Cardiovascular: Normal rate, regular rhythm and normal heart sounds.   Pulmonary/Chest: Effort normal and breath sounds normal. No respiratory distress. She has no wheezes. She  exhibits no tenderness.  Abdominal: Soft. Bowel sounds are normal. She exhibits no distension. There is no tenderness.  Musculoskeletal: Normal range of motion.  Neurological: She is alert and oriented to person, place, and time. No cranial nerve deficit.  Skin: Skin is warm and dry. No rash noted.  Psychiatric: Mood and affect normal.   LABORATORY PANEL:  Female CBC  Recent Labs Lab 12/02/16 0324  WBC 9.2  HGB 11.5*  HCT 33.0*  PLT 295   ------------------------------------------------------------------------------------------------------------------ Chemistries   Recent Labs Lab 12/02/16 0324 12/02/16 1028  NA 135  --   K 2.8* 3.4*  CL 108  --   CO2 21*  --   GLUCOSE 233*  --   BUN 10  --   CREATININE 0.52  --   CALCIUM 8.7*  --   MG 1.4*  --   AST 9*  --   ALT 7*  --   ALKPHOS 59  --   BILITOT 0.6  --    RADIOLOGY:  Ct Abdomen Pelvis W Contrast  Result Date: 12/02/2016 CLINICAL DATA:  Abdominal pain EXAM: CT ABDOMEN AND PELVIS WITH CONTRAST TECHNIQUE: Multidetector CT imaging of the abdomen and pelvis was performed using the standard protocol following bolus administration of intravenous contrast. CONTRAST:  171mL ISOVUE-300 IOPAMIDOL (ISOVUE-300) INJECTION 61% COMPARISON:  None. FINDINGS: Lower chest:  No contributory findings. Hepatobiliary: No focal liver abnormality.Cholecystectomy. No common bile duct dilatation or visible stone. Pancreas: The body and tail of the pancreas is expanded and indistinct. Regional retroperitoneal edema. There are 2 calcifications in line with the main pancreatic duct, which is more proximally dilated. The largest stone measures 5 mm at the level of the body/neck junction. Spleen: Unremarkable. Adrenals/Urinary Tract: Negative  adrenals. Simple appearing 15 mm right lower pole cyst. Neighboring streaky density on delayed phase has subtle volume loss and is likely scarring. No hydronephrosis. Prominent distention of the urinary bladder without  definite acute superimposed finding. Stomach/Bowel:  No obstruction. No appendicitis. Vascular/Lymphatic: No acute vascular abnormality. The splenic vein is patent. Mild atheromatous wall thickening of the aorta. Calcifications of the iliac branches. No mass or adenopathy. Reproductive:Hysterectomy and possible oophorectomies. Other: Small volume pelvic ascites, likely reactive. Musculoskeletal: No acute abnormalities. IMPRESSION: Acute pancreatitis without necrosis or fluid collection. There is a 5 mm stone in the main pancreatic duct at the level of the body/neck with proximal duct dilatation. Cholecystectomy. Electronically Signed   By: Monte Fantasia M.D.   On: 12/02/2016 14:55   ASSESSMENT AND PLAN:  51 y.o. female with a known history of DM, HTN, HLD and GERD. She has had left-sided abdominal pain for the last 3 months,  nausea and vomiting for 2 days. Per her sister, she is not compliance to her insulin and sometime run out of medications  * DKA - resolved. - due to meds noncomplaince - now having hypoglycemia  * Pancreatic duct stone: seen on CT will need ERCP, d/w Dr Allen Norris who recommends transfer to [facility]  * ARF. Resolved with hydration  * Pseudohyponatremia: resolved  * Elevated bilirubin, due to nausea and vomiting. F/u level in am  * HTN. Continue continue lisinopril     All the records are reviewed and case discussed with Care Management/Social Worker. Management plans discussed with the patient, nursing and they are in agreement.  CODE STATUS: Full Code  TOTAL TIME TAKING CARE OF THIS PATIENT: 35 minutes.   More than 50% of the time was spent in counseling/coordination of care: YES  Waiting for transfer to Conni Slipper M.D on 12/02/2016 at 3:15 PM  Between 7am to 6pm - Pager - [phone]  After 6pm go to [email or web address] - Technical brewer [place] [facility]  Office  [phone]  CC: Primary care physician; [facility], [place]  Note: This dictation was prepared with Diplomatic Services operational officer dictation along with smaller phrase technology. Any transcriptional errors that result from this process are unintentional.

## 2016-12-02 NOTE — Progress Notes (Signed)
Inpatient Diabetes Program Recommendations  AACE/ADA: New Consensus Statement on Inpatient Glycemic Control (2015)  Target Ranges:  Prepandial:   less than 140 mg/dL      Peak postprandial:   less than 180 mg/dL (1-2 hours)      Critically ill patients:  140 - 180 mg/dL   Lab Results  Component Value Date   GLUCAP 68 12/02/2016   HGBA1C >15.5 (H) 11/30/2016    Review of Glycemic Control  Results for CRISSIE, ALOI (MRN [id number]) as of 12/02/2016 12:42  Ref. Range 12/02/2016 07:39 12/02/2016 11:51 12/02/2016 11:53 12/02/2016 12:12  Glucose-Capillary Latest Ref Range: 65 - 99 mg/dL 124 (H) 43 (LL) 41 (LL) 68    Recent low blood sugar noted- consider decreasing Lantus to 35 units bid, decrease Novolog to 3 units tid with meals.   Gentry Fitz, RN, BA, MHA, CDE Diabetes Coordinator Inpatient Diabetes Program  [phone] (Team Pager) [phone] ([place]) 12/02/2016 12:45 PM

## 2016-12-02 NOTE — Progress Notes (Signed)
Lab called with Potassium level of 2.8.  MD advised.  Waiting on response.

## 2016-12-02 NOTE — Progress Notes (Signed)
Inpatient Diabetes Program Recommendations  AACE/ADA: New Consensus Statement on Inpatient Glycemic Control (2015)  Target Ranges:  Prepandial:   less than 140 mg/dL      Peak postprandial:   less than 180 mg/dL (1-2 hours)      Critically ill patients:  140 - 180 mg/dL   Lab Results  Component Value Date   GLUCAP 277 (H) 12/01/2016   HGBA1C >15.5 (H) 11/30/2016    Review of Glycemic Control  Results for MAKYNNA, MANOCCHIO (MRN [id number]) as of 12/02/2016 09:20  Ref. Range 12/01/2016 07:13 12/01/2016 11:22 12/01/2016 16:18 12/01/2016 22:28 12/02/2016 07:39  Glucose-Capillary Latest Ref Range: 65 - 99 mg/dL 253 (H) 201 (H) 226 (H) 277 (H) 124 (H)    Outpatient Diabetes medications: Lantus 80 units QHS, Novolog 25 units QID, Metformin 1000 mg BID (per patient, has not taken any DM meds in over 2 months)  Current orders for Inpatient glycemic control: Lantus 40 units BID, Novolog 0-9 units TID with meals, novolog 4 units tid with meals  Inpatient Diabetes Program Recommendations:Agree with current medications for blood sugar management.   Gentry Fitz, RN, BA, MHA, CDE Diabetes Coordinator Inpatient Diabetes Program  [phone] (Team Pager) [phone] ([place]) 12/02/2016 9:21 AM

## 2016-12-03 ENCOUNTER — Telehealth: Payer: Self-pay | Admitting: Gastroenterology

## 2016-12-03 LAB — GLUCOSE, CAPILLARY
GLUCOSE-CAPILLARY: 100 mg/dL — AB (ref 65–99)
Glucose-Capillary: 187 mg/dL — ABNORMAL HIGH (ref 65–99)
Glucose-Capillary: 213 mg/dL — ABNORMAL HIGH (ref 65–99)

## 2016-12-03 LAB — BASIC METABOLIC PANEL
ANION GAP: 5 (ref 5–15)
BUN: 6 mg/dL (ref 6–20)
CHLORIDE: 104 mmol/L (ref 101–111)
CO2: 27 mmol/L (ref 22–32)
Calcium: 9.1 mg/dL (ref 8.9–10.3)
Creatinine, Ser: 0.63 mg/dL (ref 0.44–1.00)
GFR calc Af Amer: >60 mL/min (ref >60)
Glucose, Bld: 169 mg/dL — ABNORMAL HIGH (ref 65–99)
POTASSIUM: 4.2 mmol/L (ref 3.5–5.1)
Sodium: 136 mmol/L (ref 135–145)

## 2016-12-03 MED ORDER — INSULIN PEN NEEDLE 31G X 5 MM MISC: 30.0000 "application " | Freq: Three times a day (TID) | 0 refills | Status: AC

## 2016-12-03 MED ORDER — METFORMIN HCL 1000 MG PO TABS: 1000.0000 mg | ORAL_TABLET | Freq: Two times a day (BID) | ORAL | 1 refills | Status: AC

## 2016-12-03 MED ORDER — FREESTYLE LANCETS MISC: 1 refills | Status: AC

## 2016-12-03 MED ORDER — LIVING WELL WITH DIABETES BOOK
Freq: Once | Status: AC
  Administered 2016-12-03: 13:00:00
  Filled 2016-12-03 (×2): qty 1

## 2016-12-03 MED ORDER — GLUCOSE BLOOD VI STRP: ORAL_STRIP | 1 refills | Status: AC

## 2016-12-03 MED ORDER — INSULIN GLARGINE 100 UNIT/ML SOLOSTAR PEN: 10.0000 [IU] | PEN_INJECTOR | Freq: Every day | SUBCUTANEOUS | 1 refills | Status: DC

## 2016-12-03 MED ORDER — IBUPROFEN 400 MG PO TABS: 400.0000 mg | ORAL_TABLET | Freq: Four times a day (QID) | ORAL | 0 refills | Status: DC | PRN

## 2016-12-03 MED ORDER — INSULIN ASPART 100 UNIT/ML ~~LOC~~ SOLN: SUBCUTANEOUS | 3 refills | Status: AC

## 2016-12-03 NOTE — Telephone Encounter (Signed)
Please see Dr. Nandigam's message 

## 2016-12-03 NOTE — Discharge Summary (Addendum)
[facility] NAME: Catherine Krueger    MR#:  [id number]  DATE OF BIRTH:  1965/10/27  DATE OF ADMISSION:  11/30/2016   ADMITTING PHYSICIAN: Demetrios Loll, MD  DATE OF DISCHARGE: 12/03/2016  PRIMARY CARE PHYSICIAN: [facility] [place]   ADMISSION DIAGNOSIS:  Diabetic ketoacidosis without coma associated with type 1 diabetes mellitus ([place]) [E10.10] DKA (diabetic ketoacidoses) ([place]) [E13.10] DISCHARGE DIAGNOSIS:  Active Problems:   DKA (diabetic ketoacidoses) ([place])  SECONDARY DIAGNOSIS:   Past Medical History:  Diagnosis Date  . Cervical cancer (HCC)    remission, dx at age 51  . Depression   . Diabetes mellitus, type II ([place])   . GERD (gastroesophageal reflux disease)   . H. pylori infection 2013  . HLD (hyperlipidemia)   . Hypertension    HOSPITAL COURSE:  51 y.o.femalewith a known history of DM, HTN, HLD and GERD. She has had left-sided abdominal pain for the last 3 months, nausea and vomiting for 2 days. Per her sister, she is not compliance to her insulin and sometime run out of medications  * DKA - resolved. - due to meds noncompliance - now having hypoglycemia  * Pancreatic duct stone: seen on CT will need ERCP, d/w Dr Allen Norris  - also talked with GI at [facility] who recommends outpt eval reviewing her clinical's and as she seems stable and her abd pain is chronic. No transaminitis  - patient prefers to see someone at [place] due to distance (she lives in [place]).  * ARF. Resolved with hydration  * Pseudohyponatremia: resolved  * Elevated bilirubin, due to nausea and vomiting. F/u level in am  * HTN. Continue continue lisinopril  DISCHARGE CONDITIONS:  stable CONSULTS OBTAINED:   DRUG ALLERGIES:   Allergies  Allergen Reactions  . Diclofenac Sodium Other (See Comments)  . Keflex [Cephalexin] Palpitations   DISCHARGE MEDICATIONS:   Allergies as of 12/03/2016      Reactions   Diclofenac Sodium Other (See Comments)   Keflex [cephalexin] Palpitations      Medication List    STOP taking these medications   atorvastatin 40 MG tablet Commonly known as:  LIPITOR   insulin glargine 100 UNIT/ML injection Commonly known as:  LANTUS Replaced by:  Insulin Glargine 100 UNIT/ML Solostar Pen   lisinopril 40 MG tablet Commonly known as:  PRINIVIL,ZESTRIL   nicotine 21 mg/24hr patch Commonly known as:  NICODERM CQ - dosed in mg/24 hours   nicotine polacrilex 2 MG gum Commonly known as:  NICORETTE   ondansetron 4 MG tablet Commonly known as:  ZOFRAN   sucralfate 1 g tablet Commonly known as:  CARAFATE   traMADol 50 MG tablet Commonly known as:  ULTRAM     TAKE these medications   ibuprofen 400 MG tablet Commonly known as:  ADVIL,MOTRIN Take 1 tablet (400 mg total) by mouth every 6 (six) hours as needed.   insulin aspart 100 UNIT/ML injection Commonly known as:  NOVOLOG 4 units TID with each meals What changed:  how much to take  how to take this  when to take this  additional instructions   Insulin Glargine 100 UNIT/ML Solostar Pen Commonly known as:  LANTUS SOLOSTAR Inject 10 Units into the skin daily at 10 pm. Replaces:  insulin glargine 100 UNIT/ML injection   metFORMIN 1000 MG tablet Commonly known as:  GLUCOPHAGE Take 1 tablet (1,000 mg total) by mouth 2 (two) times daily with a meal.  DISCHARGE INSTRUCTIONS:   DIET:  Low fat, Low cholesterol diet DISCHARGE CONDITION:  Good ACTIVITY:  Activity as tolerated OXYGEN:  Home Oxygen: No.  Oxygen Delivery: room air DISCHARGE LOCATION:  Home  If you experience worsening of your admission symptoms, develop shortness of breath, life threatening emergency, suicidal or homicidal thoughts you must seek medical attention immediately by calling 911 or calling your MD immediately  if symptoms less severe.  You Must read complete instructions/literature along with all the possible  adverse reactions/side effects for all the Medicines you take and that have been prescribed to you. Take any new Medicines after you have completely understood and accpet all the possible adverse reactions/side effects.   Please note  You were cared for by a hospitalist during your hospital stay. If you have any questions about your discharge medications or the care you received while you were in the hospital after you are discharged, you can call the unit and asked to speak with the hospitalist on call if the hospitalist that took care of you is not available. Once you are discharged, your primary care physician will handle any further medical issues. Please note that NO REFILLS for any discharge medications will be authorized once you are discharged, as it is imperative that you return to your primary care physician (or establish a relationship with a primary care physician if you do not have one) for your aftercare needs so that they can reassess your need for medications and monitor your lab values.    On the day of Discharge:  VITAL SIGNS:  Blood pressure (!) 102/59, pulse 80, temperature 98.8 F (37.1 C), temperature source Oral, resp. rate 20, height 5\' 3"  (1.6 m), weight 54.9 kg (121 lb), SpO2 100 %. PHYSICAL EXAMINATION:  GENERAL:  51 y.o.-year-old patient lying in the bed with no acute distress.  EYES: Pupils equal, round, reactive to light and accommodation. No scleral icterus. Extraocular muscles intact.  HEENT: Head atraumatic, normocephalic. Oropharynx and nasopharynx clear.  NECK:  Supple, no jugular venous distention. No thyroid enlargement, no tenderness.  LUNGS: Normal breath sounds bilaterally, no wheezing, rales,rhonchi or crepitation. No use of accessory muscles of respiration.  CARDIOVASCULAR: S1, S2 normal. No murmurs, rubs, or gallops.  ABDOMEN: Soft, non-tender, non-distended. Bowel sounds present. No organomegaly or mass.  EXTREMITIES: No pedal edema, cyanosis, or  clubbing.  NEUROLOGIC: Cranial nerves II through XII are intact. Muscle strength 5/5 in all extremities. Sensation intact. Gait not checked.  PSYCHIATRIC: The patient is alert and oriented x 3.  SKIN: No obvious rash, lesion, or ulcer.  DATA REVIEW:   CBC  Recent Labs Lab 12/02/16 0324  WBC 9.2  HGB 11.5*  HCT 33.0*  PLT 295    Chemistries   Recent Labs Lab 12/02/16 0324  12/03/16 0543  NA 135  --  136  K 2.8*  < > 4.2  CL 108  --  104  CO2 21*  --  27  GLUCOSE 233*  --  169*  BUN 10  --  6  CREATININE 0.52  --  0.63  CALCIUM 8.7*  --  9.1  MG 1.4*  --   --   AST 9*  --   --   ALT 7*  --   --   ALKPHOS 59  --   --   BILITOT 0.6  --   --   < > = values in this interval not displayed.   Microbiology Results  Results for orders placed or  performed during the hospital encounter of 11/30/16  MRSA PCR Screening     Status: None   Collection Time: 11/30/16  4:25 PM  Result Value Ref Range Status   MRSA by PCR NEGATIVE NEGATIVE Final    Comment:        The GeneXpert MRSA Assay (FDA approved for NASAL specimens only), is one component of a comprehensive MRSA colonization surveillance program. It is not intended to diagnose MRSA infection nor to guide or monitor treatment for MRSA infections.     RADIOLOGY:  Ct Abdomen Pelvis W Contrast  Result Date: 12/02/2016 CLINICAL DATA:  Abdominal pain EXAM: CT ABDOMEN AND PELVIS WITH CONTRAST TECHNIQUE: Multidetector CT imaging of the abdomen and pelvis was performed using the standard protocol following bolus administration of intravenous contrast. CONTRAST:  153mL ISOVUE-300 IOPAMIDOL (ISOVUE-300) INJECTION 61% COMPARISON:  None. FINDINGS: Lower chest:  No contributory findings. Hepatobiliary: No focal liver abnormality.Cholecystectomy. No common bile duct dilatation or visible stone. Pancreas: The body and tail of the pancreas is expanded and indistinct. Regional retroperitoneal edema. There are 2 calcifications in line  with the main pancreatic duct, which is more proximally dilated. The largest stone measures 5 mm at the level of the body/neck junction. Spleen: Unremarkable. Adrenals/Urinary Tract: Negative adrenals. Simple appearing 15 mm right lower pole cyst. Neighboring streaky density on delayed phase has subtle volume loss and is likely scarring. No hydronephrosis. Prominent distention of the urinary bladder without definite acute superimposed finding. Stomach/Bowel:  No obstruction. No appendicitis. Vascular/Lymphatic: No acute vascular abnormality. The splenic vein is patent. Mild atheromatous wall thickening of the aorta. Calcifications of the iliac branches. No mass or adenopathy. Reproductive:Hysterectomy and possible oophorectomies. Other: Small volume pelvic ascites, likely reactive. Musculoskeletal: No acute abnormalities. IMPRESSION: Acute pancreatitis without necrosis or fluid collection. There is a 5 mm stone in the main pancreatic duct at the level of the body/neck with proximal duct dilatation. Cholecystectomy. Electronically Signed   By: Monte Fantasia M.D.   On: 12/02/2016 14:55     Management plans discussed with the patient, family and they are in agreement.  CODE STATUS: Full Code   TOTAL TIME TAKING CARE OF THIS PATIENT: 45 minutes.    Max Sane M.D on 12/03/2016 at 8:01 AM  Between 7am to 6pm - Pager - [phone]  After 6pm go to [email or web address] - [employer] [facility]  Office  [phone]  CC: Primary care physician; [facility] [place]   Note: This dictation was prepared with Diplomatic Services operational officer dictation along with smaller phrase technology. Any transcriptional errors that result from this process are unintentional.

## 2016-12-03 NOTE — Discharge Instructions (Signed)
Diabetic Ketoacidosis °Diabetic ketoacidosis is a life-threatening complication of diabetes. If it is not treated, it can cause severe dehydration and organ damage and can lead to a coma or death. °What are the causes? °This condition develops when there is not enough of the hormone insulin in the body. Insulin helps the body to break down sugar for energy. Without insulin, the body cannot break down sugar, so it breaks down fats instead. This leads to the production of acids that are called ketones. Ketones are poisonous at high levels. °This condition can be triggered by: °· Stress on the body that is brought on by an illness. °· Medicines that raise blood glucose levels. °· Not taking diabetes medicine. ° °What are the signs or symptoms? °Symptoms of this condition include: °· Fatigue. °· Weight loss. °· Excessive thirst. °· Light-headedness. °· Fruity or sweet-smelling breath. °· Excessive urination. °· Vision changes. °· Confusion or irritability. °· Nausea. °· Vomiting. °· Rapid breathing. °· Abdominal pain. °· Feeling flushed. ° °How is this diagnosed? °This condition is diagnosed based on a medical history, a physical exam, and blood tests. You may also have a urine test that checks for ketones. °How is this treated? °This condition may be treated with: °· Fluid replacement. This may be done to correct dehydration. °· Insulin injections. These may be given through the skin or through an IV tube. °· Electrolyte replacement. Electrolytes, such as potassium and sodium, may be given in pill form or through an IV tube. °· Antibiotic medicines. These may be prescribed if your condition was caused by an infection. ° °Follow these instructions at home: °Eating and drinking °· Drink enough fluids to keep your urine clear or pale yellow. °· If you cannot eat, alternate between drinking fluids with sugar (such as juice) and salty fluids (such as broth or bouillon). °· If you can eat, follow your usual diet and drink  sugar-free liquids, such as water. °Other Instructions ° °· Take insulin as directed by your health care provider. Do not skip insulin injections. Do not use expired insulin. °· If your blood sugar is over 240 mg/dL, monitor your urine ketones every 4-6 hours. °· If you were prescribed an antibiotic medicine, finish all of it even if you start to feel better. °· Rest and exercise only as directed by your health care provider. °· If you get sick, call your health care provider and begin treatment quickly. Your body often needs extra insulin to fight an illness. °· Check your blood glucose levels regularly. If your blood glucose is high, drink plenty of fluids. This helps to flush out ketones. °Contact a health care provider if: °· Your blood glucose level is too high or too low. °· You have ketones in your urine. °· You have a fever. °· You cannot eat. °· You cannot tolerate fluids. °· You have been vomiting for more than 2 hours. °· You continue to have symptoms of this condition. °· You develop new symptoms. °Get help right away if: °· Your blood glucose levels continue to be high (elevated). °· Your monitor reads “high” even when you are taking insulin. °· You faint. °· You have chest pain. °· You have trouble breathing. °· You have a sudden, severe headache. °· You have sudden weakness in one arm or one leg. °· You have sudden trouble speaking or swallowing. °· You have vomiting or diarrhea that gets worse after 3 hours. °· You feel severely fatigued. °· You have trouble thinking. °· You   have abdominal pain. °· You are severely dehydrated. Symptoms of severe dehydration include: °? Extreme thirst. °? Dry mouth. °? Blue lips. °? Cold hands and feet. °? Rapid breathing. °This information is not intended to replace advice given to you by your health care provider. Make sure you discuss any questions you have with your health care provider. °Document Released: 04/09/2000 Document Revised: 09/18/2015 Document  Reviewed: 03/20/2014 °Elsevier Interactive Patient Education © 2017 Elsevier Inc. ° °

## 2016-12-03 NOTE — Telephone Encounter (Signed)
A user error has taken place.

## 2016-12-03 NOTE — Care Management Important Message (Signed)
Important Message  Patient Details  Name: NELSON JULSON MRN: [id number] Date of Birth: 10/17/1965   Medicare Important Message Given:  Yes    Shelbie Ammons, RN 12/03/2016, 9:13 AM

## 2016-12-03 NOTE — Progress Notes (Addendum)
Inpatient Diabetes Program Recommendations  AACE/ADA: New Consensus Statement on Inpatient Glycemic Control (2015)  Target Ranges:  Prepandial:   less than 140 mg/dL      Peak postprandial:   less than 180 mg/dL (1-2 hours)      Critically ill patients:  140 - 180 mg/dL   Lab Results  Component Value Date   GLUCAP 100 (H) 12/03/2016   HGBA1C >15.5 (H) 11/30/2016    Review of Glycemic Control  Results for JAIMEE, CORUM (MRN [id number]) as of 12/03/2016 07:40  Ref. Range 12/02/2016 12:12 12/02/2016 12:38 12/02/2016 16:55 12/02/2016 19:54 12/03/2016 05:08  Glucose-Capillary Latest Ref Range: 65 - 99 mg/dL 68 105 (H) 117 (H) 179 (H) 100 (H)    Outpatient Diabetes medications: Lantus 80 units QHS, Novolog 25 units QID, Metformin 1000 mg BID (per patient, has not taken any DM meds in over 2 months)  Current orders for Inpatient glycemic control:  Novolog 0-9 units TID with meals, Novolog 4 units tid with meals  Inpatient Diabetes Program Recommendations:Consider restarting a portion of her Lantus insulin- consider Lantus 10 units qday beginning this am (A1C >15.5%)  Gentry Fitz, RN, BA, [place], CDE Diabetes Coordinator Inpatient Diabetes Program  [phone] (Team Pager) [phone] ([place]) 12/03/2016 7:42 AM   Addendum- met with patient- reviewed insulin pen administration; she has used pens in the past- we reviewed storage, site selection, rotation, expiry and the differences between Novolog and Lantus. Return demo completed- she is not using alcohol on the pen each administration and not doing the "air shot"- re-educated.  Discussed A1C of >15.5%  Using pictorial and verbal education. Discussed the long term risk of uncontrolled diabetes.  Reviewed the treatment of low blood sugars- patient verbalizes that she uses pancake syrup to bring blood sugar up- encouraged to limit to 1 tablespoon, wait 15 minutes and follow up with a snack or meal after 15 minutes.  Encouraged to  check blood sugars as directed (patient verbalizes she tries to check 3 times a day). Encouraged to eat three meals - I briefly discussed the Plate Method of eating- encouraging patient to eat protein, carbohydrates and low carbohydrate vegetables at each meal.  In an attempt to not overwhelm the patient - encouraged to eat 3 meals and eliminate sweet tea from her diet. Patient already keeps orange juice out of her house because it is tempting for her to drink.   Patient tells me she has an appointment with endo in October but unsure of her next appointment with family doctor.  Patient tells me she is going to go to [place] after discharge, will follow up with doctors, and take medications as ordered.  I have encouraged her to go to the diabetes education at the [facility].  I have ordered the Living Well with Diabetes book.  She verbalizes understanding and has no further questions at this time.  Gentry Fitz, RN, BA, MHA, CDE Diabetes Coordinator Inpatient Diabetes Program  [phone] (Team Pager) [phone] ([place]) 12/03/2016 9:24 AM

## 2016-12-03 NOTE — Care Management (Signed)
Discharge to home today per Dr. Manuella Ghazi. Will follow-up at [facility] will be following for skilled nursing. Will be going to live with sister. Sister will be transporting today. Shelbie Ammons RN MSN CCM Care Management [phone]

## 2016-12-03 NOTE — Telephone Encounter (Signed)
Routed to DOD, Dr. Silverio Decamp. Please advise.

## 2016-12-03 NOTE — Progress Notes (Signed)
While rounding, Mercedes made initial visit to room 112. Pt was awake and alert and preparing for discharge. Pt stated that she has been recommended to an MD in [facility] for help with her Pancreatic duct stone. Pt asked for prayer that a hospital would be found who can perform this procedure, which was provided.     12/03/16 1000  Clinical Encounter Type  Visited With Patient;Health care provider  Visit Type Initial;Spiritual support  Consult/Referral To Chaplain  Spiritual Encounters  Spiritual Needs Prayer

## 2016-12-03 NOTE — Telephone Encounter (Signed)
Doc the day She hasnt seen anyone from our group here. I see that patient was referred to Dr Allen Norris in [place]. Please advise patient to call the office.

## 2016-12-03 NOTE — Progress Notes (Signed)
No changes overnight. Pnt has slept no c/o pain or discomfort. Pnt SBP low this AM. Pnt was asked to awaken and sit for vital signs. Pnt denied any dizziness or light headed. Pnt stated "I feel good". BP retaken after pnt fully awake. BP baseline as to what she has been during this hospitalization. Pnt blood glucose 100. No other concerns at this time will continue to monitor and assess. Bed low, locked and call bell in reach.

## 2016-12-06 ENCOUNTER — Telehealth: Payer: Self-pay | Admitting: Gastroenterology

## 2016-12-06 LAB — GLUCOSE, CAPILLARY: Glucose-Capillary: 43 mg/dL — CL (ref 65–99)

## 2016-12-06 NOTE — Telephone Encounter (Signed)
Left message for patient to return my call.

## 2016-12-06 NOTE — Telephone Encounter (Signed)
Patient left a voice message to set up GI appt. I tried to return her call no answer and no voice message.

## 2016-12-08 LAB — BLOOD GAS, VENOUS
ACID-BASE DEFICIT: 22.1 mmol/L — AB (ref 0.0–2.0)
Bicarbonate: 6.4 mmol/L — ABNORMAL LOW (ref 20.0–28.0)
PATIENT TEMPERATURE: 37
pCO2, Ven: 22 mmHg — ABNORMAL LOW (ref 44.0–60.0)
pH, Ven: 7.07 — CL (ref 7.250–7.430)
pO2, Ven: UNDETERMINED mmHg (ref 32.0–45.0)

## 2016-12-09 ENCOUNTER — Ambulatory Visit: Payer: Self-pay | Admitting: *Deleted

## 2016-12-13 ENCOUNTER — Ambulatory Visit: Payer: Self-pay | Admitting: *Deleted

## 2016-12-21 ENCOUNTER — Other Ambulatory Visit: Payer: Self-pay | Admitting: Student

## 2016-12-21 DIAGNOSIS — R1310 Dysphagia, unspecified: Secondary | ICD-10-CM

## 2016-12-28 ENCOUNTER — Ambulatory Visit: Payer: Medicare Other | Admitting: *Deleted

## 2016-12-29 ENCOUNTER — Ambulatory Visit: Payer: Medicare Other

## 2017-01-05 ENCOUNTER — Other Ambulatory Visit: Payer: Self-pay

## 2017-01-05 ENCOUNTER — Encounter: Payer: Self-pay | Admitting: Gastroenterology

## 2017-01-05 ENCOUNTER — Ambulatory Visit: Payer: Medicare Other | Admitting: Gastroenterology

## 2017-01-05 ENCOUNTER — Ambulatory Visit: Payer: Medicare Other | Attending: Student

## 2017-01-05 DIAGNOSIS — T451X5A Adverse effect of antineoplastic and immunosuppressive drugs, initial encounter: Secondary | ICD-10-CM

## 2017-01-05 DIAGNOSIS — E119 Type 2 diabetes mellitus without complications: Secondary | ICD-10-CM | POA: Insufficient documentation

## 2017-01-05 DIAGNOSIS — G620 Drug-induced polyneuropathy: Secondary | ICD-10-CM | POA: Insufficient documentation

## 2017-01-18 ENCOUNTER — Ambulatory Visit: Admission: RE | Admit: 2017-01-18 | Payer: Medicare Other | Source: Ambulatory Visit

## 2017-01-28 ENCOUNTER — Emergency Department
Admission: EM | Admit: 2017-01-28 | Discharge: 2017-01-28 | Disposition: A | Discharge status: Home / Self Care | Payer: Medicare Other | Attending: Emergency Medicine | Admitting: Emergency Medicine

## 2017-01-28 ENCOUNTER — Encounter: Payer: Self-pay | Admitting: Emergency Medicine

## 2017-01-28 DIAGNOSIS — I10 Essential (primary) hypertension: Secondary | ICD-10-CM | POA: Diagnosis not present

## 2017-01-28 DIAGNOSIS — R1012 Left upper quadrant pain: Secondary | ICD-10-CM | POA: Insufficient documentation

## 2017-01-28 DIAGNOSIS — Z794 Long term (current) use of insulin: Secondary | ICD-10-CM | POA: Insufficient documentation

## 2017-01-28 DIAGNOSIS — F17210 Nicotine dependence, cigarettes, uncomplicated: Secondary | ICD-10-CM | POA: Diagnosis not present

## 2017-01-28 DIAGNOSIS — Z79899 Other long term (current) drug therapy: Secondary | ICD-10-CM | POA: Insufficient documentation

## 2017-01-28 DIAGNOSIS — E119 Type 2 diabetes mellitus without complications: Secondary | ICD-10-CM | POA: Diagnosis not present

## 2017-01-28 LAB — CBC
HEMATOCRIT: 31.7 % — AB (ref 35.0–47.0)
HEMOGLOBIN: 10.9 g/dL — AB (ref 12.0–16.0)
MCH: 30.5 pg (ref 26.0–34.0)
MCHC: 34.4 g/dL (ref 32.0–36.0)
MCV: 88.7 fL (ref 80.0–100.0)
Platelets: 568 10*3/uL — ABNORMAL HIGH (ref 150–440)
RBC: 3.57 MIL/uL — ABNORMAL LOW (ref 3.80–5.20)
RDW: 13.4 % (ref 11.5–14.5)
WBC: 13.9 10*3/uL — ABNORMAL HIGH (ref 3.6–11.0)

## 2017-01-28 LAB — COMPREHENSIVE METABOLIC PANEL
ALBUMIN: 4.2 g/dL (ref 3.5–5.0)
ALT: 10 U/L — ABNORMAL LOW (ref 14–54)
ANION GAP: 10 (ref 5–15)
AST: 16 U/L (ref 15–41)
Alkaline Phosphatase: 60 U/L (ref 38–126)
BILIRUBIN TOTAL: 0.8 mg/dL (ref 0.3–1.2)
BUN: 12 mg/dL (ref 6–20)
CHLORIDE: 100 mmol/L — AB (ref 101–111)
CO2: 28 mmol/L (ref 22–32)
Calcium: 9.8 mg/dL (ref 8.9–10.3)
Creatinine, Ser: 0.52 mg/dL (ref 0.44–1.00)
GFR calc Af Amer: >60 mL/min (ref >60)
GFR calc non Af Amer: >60 mL/min (ref >60)
GLUCOSE: 125 mg/dL — AB (ref 65–99)
POTASSIUM: 3.2 mmol/L — AB (ref 3.5–5.1)
SODIUM: 138 mmol/L (ref 135–145)
TOTAL PROTEIN: 7.5 g/dL (ref 6.5–8.1)

## 2017-01-28 LAB — LIPASE, BLOOD: LIPASE: 59 U/L — AB (ref 11–51)

## 2017-01-28 MED ORDER — FAMOTIDINE 20 MG PO TABS
40.0000 mg | ORAL_TABLET | Freq: Once | ORAL | Status: AC
  Administered 2017-01-28: 40 mg via ORAL
  Filled 2017-01-28: qty 2

## 2017-01-28 MED ORDER — GI COCKTAIL ~~LOC~~
30.0000 mL | Freq: Once | ORAL | Status: AC
  Administered 2017-01-28: 30 mL via ORAL
  Filled 2017-01-28: qty 30

## 2017-01-28 MED ORDER — FAMOTIDINE 20 MG PO TABS: 20.0000 mg | ORAL_TABLET | Freq: Two times a day (BID) | ORAL | 0 refills | Status: AC

## 2017-01-28 NOTE — Discharge Instructions (Signed)
Please take your Pepcid twice a day for a full month and follow-up with your gastroenterologist as already scheduled. Return to the emergency department sooner for any concerns.  It was a pleasure to take care of you today, and thank you for coming to our emergency department.  If you have any questions or concerns before leaving please ask the nurse to grab me and I'm more than happy to go through your aftercare instructions again.  If you were prescribed any opioid pain medication today such as Norco, Vicodin, Percocet, morphine, hydrocodone, or oxycodone please make sure you do not drive when you are taking this medication as it can alter your ability to drive safely.  If you have any concerns once you are home that you are not improving or are in fact getting worse before you can make it to your follow-up appointment, please do not hesitate to call 911 and come back for further evaluation.  Darel Hong, MD  Results for orders placed or performed during the hospital encounter of 01/28/17  Lipase, blood  Result Value Ref Range   Lipase 59 (H) 11 - 51 U/L  Comprehensive metabolic panel  Result Value Ref Range   Sodium 138 135 - 145 mmol/L   Potassium 3.2 (L) 3.5 - 5.1 mmol/L   Chloride 100 (L) 101 - 111 mmol/L   CO2 28 22 - 32 mmol/L   Glucose, Bld 125 (H) 65 - 99 mg/dL   BUN 12 6 - 20 mg/dL   Creatinine, Ser 0.52 0.44 - 1.00 mg/dL   Calcium 9.8 8.9 - 10.3 mg/dL   Total Protein 7.5 6.5 - 8.1 g/dL   Albumin 4.2 3.5 - 5.0 g/dL   AST 16 15 - 41 U/L   ALT 10 (L) 14 - 54 U/L   Alkaline Phosphatase 60 38 - 126 U/L   Total Bilirubin 0.8 0.3 - 1.2 mg/dL   GFR calc non Af Amer >60 >60 mL/min   GFR calc Af Amer >60 >60 mL/min   Anion gap 10 5 - 15  CBC  Result Value Ref Range   WBC 13.9 (H) 3.6 - 11.0 K/uL   RBC 3.57 (L) 3.80 - 5.20 MIL/uL   Hemoglobin 10.9 (L) 12.0 - 16.0 g/dL   HCT 31.7 (L) 35.0 - 47.0 %   MCV 88.7 80.0 - 100.0 fL   MCH 30.5 26.0 - 34.0 pg   MCHC 34.4 32.0 - 36.0  g/dL   RDW 13.4 11.5 - 14.5 %   Platelets 568 (H) 150 - 440 K/uL

## 2017-01-28 NOTE — ED Triage Notes (Signed)
First Nurse Note:  C/O LLQ pain x 1-2 weeks.  States has history of pancreatic stones, and CBG has been elevated as well.

## 2017-01-28 NOTE — ED Provider Notes (Signed)
[facility] Emergency Department Provider Note  ____________________________________________   First MD Initiated Contact with Patient 01/28/17 1704     (approximate)  I have reviewed the triage vital signs and the nursing notes.   HISTORY  Chief Complaint Abdominal Pain    HPI Catherine Krueger is a 51 y.o. female who presents to the emergency department with 2 weeks of intermittent moderate severity cramping left lower quadrant pain. It's associated with nausea but no vomiting. No diarrhea. No fevers or chills. She says she has a past medical history of pancreatic stones scheduled to have them removed at [facility] in the near future. There is no difference in her pain today she is just frustrated and wanted to come to the emergency department for evaluation. Nothing seems to make the pain better or worse.   Past Medical History:  Diagnosis Date  . Cervical cancer (HCC)    remission, dx at age 89  . Depression   . Diabetes mellitus, type II ([place])   . GERD (gastroesophageal reflux disease)   . H. pylori infection 2013  . HLD (hyperlipidemia)   . Hypertension     Patient Active Problem List   Diagnosis Date Noted  . Diabetes mellitus type 2, uncomplicated ([place]) 50/93/2671  . 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 Hypokalemia 02/24/2015  . Hyponatremia 02/24/2015  . Leukocytosis 02/24/2015  . Anemia 02/24/2015  . Obesity 02/24/2015  . Dehydration 02/24/2015  . Tobacco abuse 02/24/2015  . DKA (diabetic ketoacidoses) ([place]) 02/22/2015  . Depression 02/22/2015  . HLD (hyperlipidemia) 02/22/2015  . HTN (hypertension) 02/22/2015  . GERD (gastroesophageal reflux disease) 02/22/2015  . UTI (lower urinary tract infection) 02/22/2015  . AKI (acute kidney injury) ([place]) 02/22/2015    Past Surgical History:  Procedure Laterality Date  . ABDOMINAL HYSTERECTOMY    . CHOLECYSTECTOMY      Prior to Admission medications     Medication Sig Start Date End Date Taking? Authorizing Provider  atorvastatin (LIPITOR) 40 MG tablet  11/04/16   [provider]  clotrimazole (LOTRIMIN) 1 % cream  12/31/16   [provider]  famotidine (PEPCID) 20 MG tablet Take 1 tablet (20 mg total) by mouth 2 (two) times daily. 01/28/17 01/28/18  Darel Hong, MD  glucose blood test strip Use as instructed 12/03/16   Max Sane, MD  ibuprofen (ADVIL,MOTRIN) 400 MG tablet Take 1 tablet (400 mg total) by mouth every 6 (six) hours as needed. 12/03/16   Max Sane, MD  insulin aspart (NOVOLOG) 100 UNIT/ML injection 4 units TID with each meals 12/03/16 12/03/17  Max Sane, MD  Insulin Glargine (LANTUS SOLOSTAR) 100 UNIT/ML Solostar Pen Inject 10 Units into the skin daily at 10 pm. 12/03/16   Max Sane, MD  Insulin Pen Needle 31G X 5 MM MISC 30 application by Does not apply route 3 (three) times daily after meals. 12/03/16   Max Sane, MD  Lancets (FREESTYLE) lancets Use as instructed 12/03/16   Max Sane, MD  metFORMIN (GLUCOPHAGE) 1000 MG tablet Take 1 tablet (1,000 mg total) by mouth 2 (two) times daily with a meal. 12/03/16 12/03/17  Max Sane, MD  omeprazole (PRILOSEC) 40 MG capsule  11/04/16   [provider]  ondansetron (ZOFRAN) 4 MG tablet  11/04/16   [provider]  sucralfate (CARAFATE) 1 g tablet  11/04/16   [provider]    Allergies Diclofenac sodium and Keflex [cephalexin]  Family History  Problem Relation Age of  Onset  . Diabetes Mother   . Heart attack Father   . Heart attack Brother   . Diabetes Brother     Social History Social History  Substance Use Topics  . Smoking status: Current Every Day Smoker    Packs/day: 1.00    Types: Cigarettes  . Smokeless tobacco: Never Used  . Alcohol use No    Review of Systems Constitutional: No fever/chills Eyes: No visual changes. ENT: No sore throat. Cardiovascular: Denies chest pain. Respiratory: Denies shortness of  breath. Gastrointestinal: positive for abdominal pain.  positive for nausea, no vomiting.  No diarrhea.  No constipation. Genitourinary: Negative for dysuria. Musculoskeletal: Negative for back pain. Skin: Negative for rash. Neurological: Negative for headaches, focal weakness or numbness.   ____________________________________________   PHYSICAL EXAM:  VITAL SIGNS: ED Triage Vitals  Enc Vitals Group     BP 01/28/17 1552 132/66     Pulse Rate 01/28/17 1552 89     Resp 01/28/17 1552 16     Temp 01/28/17 1552 98.2 F (36.8 C)     Temp Source 01/28/17 1552 Oral     SpO2 01/28/17 1552 99 %     Weight 01/28/17 1551 127 lb (57.6 kg)     Height 01/28/17 1551 5\' 2"  (1.575 m)     Head Circumference --      Peak Flow --      Pain Score 01/28/17 1551 6     Pain Loc --      Pain Edu? --      Excl. in [place]? --     Constitutional: alert and oriented 4 pleasant cooperative speaks in full clear sentences no diaphoresis Eyes: PERRL EOMI. Head: Atraumatic. Nose: No congestion/rhinnorhea. Mouth/Throat: No trismus Neck: No stridor.   Cardiovascular: Normal rate, regular rhythm. Grossly normal heart sounds.  Good peripheral circulation. Respiratory: Normal respiratory effort.  No retractions. Lungs CTAB and moving good air Gastrointestinal: soft nondistended nontender no rebound or guarding no peritonitis no McBurney's tenderness negative Rovsing's no costovertebral tenderness Musculoskeletal: No lower extremity edema   Neurologic:  Normal speech and language. No gross focal neurologic deficits are appreciated. Skin:  Skin is warm, dry and intact. No rash noted. Psychiatric: Mood and affect are normal. Speech and behavior are normal.    ____________________________________________   DIFFERENTIAL includes but not limited to  peritonitis, biliary colic, appendicitis, diverticulitis, volvulus ____________________________________________   LABS (all labs ordered are listed, but only  abnormal results are displayed)  Labs Reviewed  LIPASE, BLOOD - Abnormal; Notable for the following:       Result Value   Lipase 59 (*)    All other components within normal limits  COMPREHENSIVE METABOLIC PANEL - Abnormal; Notable for the following:    Potassium 3.2 (*)    Chloride 100 (*)    Glucose, Bld 125 (*)    ALT 10 (*)    All other components within normal limits  CBC - Abnormal; Notable for the following:    WBC 13.9 (*)    RBC 3.57 (*)    Hemoglobin 10.9 (*)    HCT 31.7 (*)    Platelets 568 (*)    All other components within normal limits    blood work reviewed and interpreted by me no evidence of pancreatitis slightly elevated white count is nonspecific __________________________________________  EKG   ____________________________________________  RADIOLOGY   ____________________________________________   PROCEDURES  Procedure(s) performed: no  Procedures  Critical Care performed: no  Observation: no ____________________________________________  INITIAL IMPRESSION / ASSESSMENT AND PLAN / ED COURSE  Pertinent labs & imaging results that were available during my care of the patient were reviewed by me and considered in my medical decision making (see chart for details).  The patient arrives with 2 weeks of left-sided abdominal pain. Her abdominal exam is benign and she is afebrile she is able to eat and drink and her blood work is unremarkable. I had a lengthy discussion with the patient regarding the possibility of advanced imaging versus follow up outpatient as are his scheduled and she agrees with outpatient follow-up. Strict return instructions are given and she is discharged home in improved condition verbalizes understanding and agreement with the plan.      ____________________________________________   FINAL CLINICAL IMPRESSION(S) / ED DIAGNOSES  Final diagnoses:  Left upper quadrant pain      NEW MEDICATIONS STARTED DURING THIS  VISIT:  Discharge Medication List as of 01/28/2017  6:36 PM    START taking these medications   Details  famotidine (PEPCID) 20 MG tablet Take 1 tablet (20 mg total) by mouth 2 (two) times daily., Starting Fri 01/28/2017, Until Sat 01/28/2018, Print         Note:  This document was prepared using Dragon voice recognition software and may include unintentional dictation errors.     Darel Hong, MD 01/29/17 [phone]

## 2017-03-31 ENCOUNTER — Other Ambulatory Visit: Payer: Self-pay | Admitting: Primary Care

## 2017-03-31 DIAGNOSIS — Z1231 Encounter for screening mammogram for malignant neoplasm of breast: Secondary | ICD-10-CM

## 2017-05-10 ENCOUNTER — Other Ambulatory Visit: Payer: Self-pay

## 2017-06-02 ENCOUNTER — Other Ambulatory Visit: Payer: Self-pay

## 2017-07-13 ENCOUNTER — Other Ambulatory Visit: Payer: Self-pay | Admitting: Physical Medicine and Rehabilitation

## 2017-07-13 DIAGNOSIS — M5416 Radiculopathy, lumbar region: Secondary | ICD-10-CM

## 2017-07-13 DIAGNOSIS — M21372 Foot drop, left foot: Secondary | ICD-10-CM

## 2017-07-21 ENCOUNTER — Ambulatory Visit
Admission: RE | Admit: 2017-07-21 | Discharge: 2017-07-21 | Disposition: A | Discharge status: Home / Self Care | Payer: Medicare Other | Source: Ambulatory Visit | Attending: Physical Medicine and Rehabilitation | Admitting: Physical Medicine and Rehabilitation

## 2017-07-21 DIAGNOSIS — M4726 Other spondylosis with radiculopathy, lumbar region: Secondary | ICD-10-CM | POA: Insufficient documentation

## 2017-07-21 DIAGNOSIS — M5416 Radiculopathy, lumbar region: Secondary | ICD-10-CM | POA: Diagnosis present

## 2017-07-21 DIAGNOSIS — M21372 Foot drop, left foot: Secondary | ICD-10-CM | POA: Insufficient documentation

## 2017-07-21 DIAGNOSIS — M4727 Other spondylosis with radiculopathy, lumbosacral region: Secondary | ICD-10-CM | POA: Diagnosis not present

## 2017-09-18 ENCOUNTER — Encounter: Payer: Self-pay | Admitting: Emergency Medicine

## 2017-09-18 ENCOUNTER — Emergency Department: Payer: Medicare Other

## 2017-09-18 ENCOUNTER — Emergency Department
Admission: EM | Admit: 2017-09-18 | Discharge: 2017-09-18 | Disposition: A | Discharge status: Home / Self Care | Payer: Medicare Other | Attending: Emergency Medicine | Admitting: Emergency Medicine

## 2017-09-18 ENCOUNTER — Other Ambulatory Visit: Payer: Self-pay

## 2017-09-18 DIAGNOSIS — E119 Type 2 diabetes mellitus without complications: Secondary | ICD-10-CM | POA: Insufficient documentation

## 2017-09-18 DIAGNOSIS — Z8541 Personal history of malignant neoplasm of cervix uteri: Secondary | ICD-10-CM | POA: Insufficient documentation

## 2017-09-18 DIAGNOSIS — Z794 Long term (current) use of insulin: Secondary | ICD-10-CM | POA: Insufficient documentation

## 2017-09-18 DIAGNOSIS — Z79899 Other long term (current) drug therapy: Secondary | ICD-10-CM | POA: Insufficient documentation

## 2017-09-18 DIAGNOSIS — I10 Essential (primary) hypertension: Secondary | ICD-10-CM | POA: Insufficient documentation

## 2017-09-18 DIAGNOSIS — R109 Unspecified abdominal pain: Secondary | ICD-10-CM

## 2017-09-18 DIAGNOSIS — R112 Nausea with vomiting, unspecified: Secondary | ICD-10-CM | POA: Insufficient documentation

## 2017-09-18 DIAGNOSIS — R1011 Right upper quadrant pain: Secondary | ICD-10-CM | POA: Insufficient documentation

## 2017-09-18 DIAGNOSIS — F17210 Nicotine dependence, cigarettes, uncomplicated: Secondary | ICD-10-CM | POA: Insufficient documentation

## 2017-09-18 LAB — COMPREHENSIVE METABOLIC PANEL
ALT: 20 U/L (ref 14–54)
ANION GAP: 16 — AB (ref 5–15)
AST: 15 U/L (ref 15–41)
Albumin: 4.3 g/dL (ref 3.5–5.0)
Alkaline Phosphatase: 87 U/L (ref 38–126)
BUN: 14 mg/dL (ref 6–20)
CALCIUM: 9.8 mg/dL (ref 8.9–10.3)
CHLORIDE: 94 mmol/L — AB (ref 101–111)
CO2: 23 mmol/L (ref 22–32)
Creatinine, Ser: 0.53 mg/dL (ref 0.44–1.00)
GFR calc Af Amer: >60 mL/min (ref >60)
GFR calc non Af Amer: >60 mL/min (ref >60)
Glucose, Bld: 203 mg/dL — ABNORMAL HIGH (ref 65–99)
Potassium: 3.3 mmol/L — ABNORMAL LOW (ref 3.5–5.1)
SODIUM: 133 mmol/L — AB (ref 135–145)
Total Bilirubin: 1.5 mg/dL — ABNORMAL HIGH (ref 0.3–1.2)
Total Protein: 8.5 g/dL — ABNORMAL HIGH (ref 6.5–8.1)

## 2017-09-18 LAB — CBC
HEMATOCRIT: 41.2 % (ref 35.0–47.0)
HEMOGLOBIN: 13.9 g/dL (ref 12.0–16.0)
MCH: 29.9 pg (ref 26.0–34.0)
MCHC: 33.8 g/dL (ref 32.0–36.0)
MCV: 88.6 fL (ref 80.0–100.0)
Platelets: 515 10*3/uL — ABNORMAL HIGH (ref 150–440)
RBC: 4.66 MIL/uL (ref 3.80–5.20)
RDW: 13.5 % (ref 11.5–14.5)
WBC: 14.9 10*3/uL — ABNORMAL HIGH (ref 3.6–11.0)

## 2017-09-18 LAB — LIPASE, BLOOD: LIPASE: 38 U/L (ref 11–51)

## 2017-09-18 MED ORDER — PROMETHAZINE HCL 25 MG PO TABS: 25.0000 mg | ORAL_TABLET | Freq: Four times a day (QID) | ORAL | 0 refills | Status: DC | PRN

## 2017-09-18 MED ORDER — PROMETHAZINE HCL 25 MG/ML IJ SOLN
25.0000 mg | Freq: Once | INTRAMUSCULAR | Status: AC
  Administered 2017-09-18: 25 mg via INTRAVENOUS
  Filled 2017-09-18: qty 1

## 2017-09-18 MED ORDER — HYDROMORPHONE HCL 1 MG/ML IJ SOLN
0.5000 mg | Freq: Once | INTRAMUSCULAR | Status: AC
  Administered 2017-09-18: 0.5 mg via INTRAVENOUS
  Filled 2017-09-18: qty 1

## 2017-09-18 MED ORDER — MORPHINE SULFATE (PF) 4 MG/ML IV SOLN
4.0000 mg | Freq: Once | INTRAVENOUS | Status: AC
  Administered 2017-09-18: 4 mg via INTRAVENOUS
  Filled 2017-09-18: qty 1

## 2017-09-18 MED ORDER — ONDANSETRON HCL 4 MG/2ML IJ SOLN
4.0000 mg | Freq: Once | INTRAMUSCULAR | Status: AC
  Administered 2017-09-18: 4 mg via INTRAVENOUS
  Filled 2017-09-18: qty 2

## 2017-09-18 MED ORDER — SODIUM CHLORIDE 0.9 % IV BOLUS
1000.0000 mL | Freq: Once | INTRAVENOUS | Status: AC
Start: 2017-09-18 — End: 2017-09-18
  Administered 2017-09-18: 1000 mL via INTRAVENOUS

## 2017-09-18 MED ORDER — IOPAMIDOL (ISOVUE-300) INJECTION 61%
100.0000 mL | Freq: Once | INTRAVENOUS | Status: AC | PRN
  Administered 2017-09-18: 100 mL via INTRAVENOUS

## 2017-09-18 MED ORDER — IOPAMIDOL (ISOVUE-300) INJECTION 61%
30.0000 mL | Freq: Once | INTRAVENOUS | Status: AC | PRN
  Administered 2017-09-18: 30 mL via ORAL

## 2017-09-18 MED ORDER — OXYCODONE-ACETAMINOPHEN 5-325 MG PO TABS: 1.0000 | ORAL_TABLET | Freq: Four times a day (QID) | ORAL | 0 refills | Status: AC | PRN

## 2017-09-18 NOTE — ED Provider Notes (Addendum)
[facility] Emergency Department Provider Note   ____________________________________________   First MD Initiated Contact with Patient 09/18/17 1214     (approximate)  I have reviewed the triage vital signs and the nursing notes.   HISTORY  Chief Complaint Abdominal Pain and Emesis    HPI Catherine Krueger is a 52 y.o. female who comes in today after having an ERCP done at [facility] 3 days ago. ERCP was done as ultrasound showed what appeared to be pancreatic duct stones. Patient had some stents placed and seemed to tolerate the procedure well. Since then however she's had increasing pain in the right upper quadrant and has had nausea and vomiting but unable to keep down any fainting. Patient feels like she's been having a fever at home.she has not had any coughing sneezing diarrhea or any other complaints.deep breathing makes the pain worse.   Past Medical History:  Diagnosis Date  . Cervical cancer (HCC)    remission, dx at age 28  . Depression   . Diabetes mellitus, type II ([place])   . GERD (gastroesophageal reflux disease)   . H. pylori infection 2013  . HLD (hyperlipidemia)   . Hypertension     Patient Active Problem List   Diagnosis Date Noted  . Diabetes mellitus type 2, uncomplicated ([place]) 56/31/4970  . 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 Hypokalemia 02/24/2015  . Hyponatremia 02/24/2015  . Leukocytosis 02/24/2015  . Anemia 02/24/2015  . Obesity 02/24/2015  . Dehydration 02/24/2015  . Tobacco abuse 02/24/2015  . DKA (diabetic ketoacidoses) ([place]) 02/22/2015  . Depression 02/22/2015  . HLD (hyperlipidemia) 02/22/2015  . HTN (hypertension) 02/22/2015  . GERD (gastroesophageal reflux disease) 02/22/2015  . UTI (lower urinary tract infection) 02/22/2015  . AKI (acute kidney injury) ([place]) 02/22/2015    Past Surgical History:  Procedure Laterality Date  . ABDOMINAL HYSTERECTOMY    . CHOLECYSTECTOMY      Prior to  Admission medications   Medication Sig Start Date End Date Taking? Authorizing Provider  atorvastatin (LIPITOR) 40 MG tablet  11/04/16   [provider]  clotrimazole (LOTRIMIN) 1 % cream  12/31/16   [provider]  famotidine (PEPCID) 20 MG tablet Take 1 tablet (20 mg total) by mouth 2 (two) times daily. 01/28/17 01/28/18  Darel Hong, MD  glucose blood test strip Use as instructed 12/03/16   Max Sane, MD  ibuprofen (ADVIL,MOTRIN) 400 MG tablet Take 1 tablet (400 mg total) by mouth every 6 (six) hours as needed. 12/03/16   Max Sane, MD  insulin aspart (NOVOLOG) 100 UNIT/ML injection 4 units TID with each meals 12/03/16 12/03/17  Max Sane, MD  Insulin Glargine (LANTUS SOLOSTAR) 100 UNIT/ML Solostar Pen Inject 10 Units into the skin daily at 10 pm. 12/03/16   Max Sane, MD  Insulin Pen Needle 31G X 5 MM MISC 30 application by Does not apply route 3 (three) times daily after meals. 12/03/16   Max Sane, MD  Lancets (FREESTYLE) lancets Use as instructed 12/03/16   Max Sane, MD  metFORMIN (GLUCOPHAGE) 1000 MG tablet Take 1 tablet (1,000 mg total) by mouth 2 (two) times daily with a meal. 12/03/16 12/03/17  Max Sane, MD  omeprazole (PRILOSEC) 40 MG capsule  11/04/16   [provider]  ondansetron (ZOFRAN) 4 MG tablet  11/04/16   [provider]  oxyCODONE-acetaminophen (PERCOCET/ROXICET) 5-325 MG tablet Take 1-2 tablets by mouth every 6 (six) hours as needed for severe pain. 09/18/17  Nena Polio, MD  promethazine (PHENERGAN) 25 MG tablet Take 1 tablet (25 mg total) by mouth every 6 (six) hours as needed for nausea or vomiting. 09/18/17   Nena Polio, MD  sucralfate (CARAFATE) 1 g tablet  11/04/16   [provider]    Allergies Diclofenac sodium and Keflex [cephalexin]  Family History  Problem Relation Age of Onset  . Diabetes Mother   . Heart attack Father   . Heart attack Brother   . Diabetes Brother     Social History Social  History   Tobacco Use  . Smoking status: Current Every Day Smoker    Packs/day: 1.00    Types: Cigarettes  . Smokeless tobacco: Never Used  Substance Use Topics  . Alcohol use: No    Alcohol/week: 0.0 oz  . Drug use: No    Review of Systems  Constitutional: subjective fever/chills Eyes: No visual changes. ENT: No sore throat. Cardiovascular: Denies chest pain. Respiratory: Denies shortness of breath. Gastrointestinal: see history of present illness Genitourinary: Negative for dysuria. Musculoskeletal: Negative for back pain. Skin: Negative for rash. Neurological: Negative for headaches, focal weakness  ____________________________________________   PHYSICAL EXAM:  VITAL SIGNS: ED Triage Vitals  Enc Vitals Group     BP 09/18/17 1129 (!) 154/78     Pulse Rate 09/18/17 1129 (!) 109     Resp 09/18/17 1129 18     Temp 09/18/17 1129 98.2 F (36.8 C)     Temp Source 09/18/17 1129 Oral     SpO2 09/18/17 1129 99 %     Weight 09/18/17 1130 137 lb (62.1 kg)     Height 09/18/17 1130 5\' 2"  (1.575 m)     Head Circumference --      Peak Flow --      Pain Score 09/18/17 1129 9     Pain Loc --      Pain Edu? --      Excl. in [place]? --     Constitutional: Alert and oriented. Well appearing and in no acute distress. Eyes: Conjunctivae are normal.  Head: Atraumatic. Nose: No congestion/rhinnorhea. Mouth/Throat: Mucous membranes are moist.  Oropharynx non-erythematous. Neck: No stridor.   Cardiovascular: Normal rate, regular rhythm. Grossly normal heart sounds.  Good peripheral circulation. Respiratory: Normal respiratory effort.  No retractions. Lungs CTAB. Gastrointestinal: Soft tender to palpation percussion in the upper abdomen especially on the right side. No distention. No abdominal bruits. No CVA tenderness. Musculoskeletal: No lower extremity tenderness nor edema.  Neurologic:  Normal speech and language. No gross focal neurologic deficits are appreciated. No gait  instability. Skin:  Skin is warm, dry and intact. Psychiatric: Mood and affect are normal. Speech and behavior are normal.  ____________________________________________   LABS (all labs ordered are listed, but only abnormal results are displayed)  Labs Reviewed  COMPREHENSIVE METABOLIC PANEL - Abnormal; Notable for the following components:      Result Value   Sodium 133 (*)    Potassium 3.3 (*)    Chloride 94 (*)    Glucose, Bld 203 (*)    Total Protein 8.5 (*)    Total Bilirubin 1.5 (*)    Anion gap 16 (*)    All other components within normal limits  CBC - Abnormal; Notable for the following components:   WBC 14.9 (*)    Platelets 515 (*)    All other components within normal limits  LIPASE, BLOOD  URINALYSIS, COMPLETE (UACMP) WITH MICROSCOPIC   ____________________________________________  EKG  ____________________________________________  RADIOLOGY  ED MD interpretation:  CT shows pancreatitis. With a normal lipase hopefully this is due to some postprocedural inflammation.  Official radiology report(s): Ct Abdomen Pelvis W Contrast  Result Date: 09/18/2017 CLINICAL DATA:  C/o mid upper abdominal pain and emesis. Pt states has been unable to hold fluids down since Thursday. On Thursday pt was seen at [facility] and had an ERCP done. HX of hyster. And chole. EXAM: CT ABDOMEN AND PELVIS WITH CONTRAST TECHNIQUE: Multidetector CT imaging of the abdomen and pelvis was performed using the standard protocol following bolus administration of intravenous contrast. CONTRAST:  149mL ISOVUE-300 IOPAMIDOL (ISOVUE-300) INJECTION 61% COMPARISON:  CT, 12/02/2016 FINDINGS: Lower chest: Small stable dense nodule at the base of the left lower lobe consistent with a healed granuloma. Lung bases otherwise clear. Hepatobiliary: Decreased attenuation of the liver consistent fatty infiltration. Liver normal in size. No liver mass or focal lesion. Status post cholecystectomy. No bile duct dilation.  Pancreas: Hazy inflammatory type change surrounds the pancreas. There are calcifications along pancreatic neck, body and tail adjacent to an irregular mildly dilated duct. There is a stent that extends from the pancreatic duct pancreatic head and to the 2nd to 3rd portion of the duodenum, new since the prior CT. Pancreas shows diffuse enhancement. No adjacent fluid collection. Spleen: Normal in size without focal abnormality. Adrenals/Urinary Tract: No adrenal masses. Kidneys are normal in size, orientation and position. There is symmetric renal enhancement excretion. 12 mm lower pole cyst on the right. No other renal masses, no stones and no hydronephrosis. Normal ureters.  Normal bladder. Stomach/Bowel: Stomach is within normal limits. Appendix appears normal. No evidence of bowel wall thickening, distention, or inflammatory changes. Vascular/Lymphatic: Portal vein, splenic vein and superior mesenteric vein are widely patent. No aortic aneurysm or significant atherosclerotic disease. Mildly prominent lymph nodes are noted adjacent to pancreas. No pathologically enlarged lymph nodes. Reproductive: Status post hysterectomy. No adnexal masses. Other: No abdominal wall hernia or abnormality. No abdominopelvic ascites. Musculoskeletal: No acute or significant osseous findings. IMPRESSION: 1. Acute on chronic pancreatitis. Pancreatitis is uncomplicated with no evidence of necrosis. No pseudocyst or abscess. No evidence of venous thrombosis. 2. No other acute abnormality within the abdomen or pelvis. 3. Pancreatic duct stent, which lies in the proximal duct in the pancreatic head, and extends into the 2nd to 3rd portion of duodenum. This was not present on the prior CT. 4. Hepatic steatosis. Electronically Signed   By: Lajean Manes M.D.   On: 09/18/2017 14:29    ____________________________________________   PROCEDURES  Procedure(s) performed:   Procedures  Critical Care performed:    ____________________________________________   INITIAL IMPRESSION / ASSESSMENT AND PLAN / ED COURSE  patient's lipase is normal however there is some inflammation on the CT scan around his pancreas. Likely this is from postprocedural inflammation. Patient is now able tolerate small amounts of by mouth fluids. Her pain is under control will try to let her go she will return if worse and follow-up with her GI doctor in the next few days.       ____________________________________________   FINAL CLINICAL IMPRESSION(S) / ED DIAGNOSES  Final diagnoses:  Abdominal pain, unspecified abdominal location     ED Discharge Orders        Ordered    promethazine (PHENERGAN) 25 MG tablet  Every 6 hours PRN     09/18/17 1529    oxyCODONE-acetaminophen (PERCOCET/ROXICET) 5-325 MG tablet  Every 6 hours PRN     09/18/17 1529  Note:  This document was prepared using Dragon voice recognition software and may include unintentional dictation errors.    Nena Polio, MD 09/18/17 1554    Nena Polio, MD 09/18/17 [phone]

## 2017-09-18 NOTE — ED Notes (Signed)
Pt states that she is unable to drink anymore of the contrast  - CT notified to come and get pt

## 2017-09-18 NOTE — ED Triage Notes (Signed)
Pt presents to ED via POV with c/o mid upper abdominal pain and emesis. Pt states has been unable to hold fluids down since Thursday. On Thursday pt was seen at [facility] and had an ERCP done. Pt presents alert and oriented, noted to be tachycardic on assessment.

## 2017-09-18 NOTE — ED Notes (Signed)
Pt ask if she could lay in the bed and sleep - advised her that she could sleep if she felt like she could

## 2017-09-18 NOTE — ED Notes (Signed)
Pt states that she had a procedure done at [facility] this week for gallstones in the duct (pt does not have a gallbladder) - pt states that she had a stent placed - since procedure pt states she has had severe abd pain with N/V - denies diarrhea

## 2017-09-18 NOTE — ED Notes (Signed)
Patient transported to CT 

## 2017-09-18 NOTE — Discharge Instructions (Signed)
You seem to have some inflammation of the pancreas probably caused by the procedure you had done. Please use the Percocet and the Phenergan to help with the pain and nausea. Take Sips of fluids in small amounts frequently for the first several hours. After that you can try a bland diet - again in small amounts frequently. By tomorrow to try to regular food. Please return here for worse pain fever or if you can't keep down fluids. Please follow-up with your gastroenterologist Tuesday or Wednesday.

## 2017-12-09 ENCOUNTER — Other Ambulatory Visit: Payer: Self-pay | Admitting: Primary Care

## 2017-12-09 DIAGNOSIS — Z0000 Encounter for general adult medical examination without abnormal findings: Secondary | ICD-10-CM

## 2017-12-09 DIAGNOSIS — Z1231 Encounter for screening mammogram for malignant neoplasm of breast: Secondary | ICD-10-CM

## 2018-01-02 ENCOUNTER — Other Ambulatory Visit: Payer: Self-pay

## 2018-01-17 ENCOUNTER — Other Ambulatory Visit: Payer: Self-pay

## 2018-02-15 ENCOUNTER — Other Ambulatory Visit: Payer: Self-pay | Admitting: Transplant Surgery

## 2018-02-15 DIAGNOSIS — K861 Other chronic pancreatitis: Secondary | ICD-10-CM

## 2018-02-15 DIAGNOSIS — Z01818 Encounter for other preprocedural examination: Secondary | ICD-10-CM

## 2018-02-20 ENCOUNTER — Ambulatory Visit
Admission: RE | Admit: 2018-02-20 | Discharge: 2018-02-20 | Disposition: A | Discharge status: Home / Self Care | Payer: Medicare Other | Source: Ambulatory Visit | Attending: Transplant Surgery | Admitting: Transplant Surgery

## 2018-02-21 ENCOUNTER — Ambulatory Visit
Admission: RE | Admit: 2018-02-21 | Discharge: 2018-02-21 | Disposition: A | Discharge status: Home / Self Care | Payer: Medicare Other | Source: Ambulatory Visit | Attending: Primary Care | Admitting: Primary Care

## 2018-02-21 DIAGNOSIS — Z0000 Encounter for general adult medical examination without abnormal findings: Secondary | ICD-10-CM

## 2018-02-21 DIAGNOSIS — R922 Inconclusive mammogram: Secondary | ICD-10-CM | POA: Insufficient documentation

## 2018-02-21 DIAGNOSIS — N6310 Unspecified lump in the right breast, unspecified quadrant: Secondary | ICD-10-CM | POA: Diagnosis not present

## 2018-02-21 HISTORY — DX: Personal history of antineoplastic chemotherapy: Z92.21

## 2018-02-21 HISTORY — DX: Personal history of irradiation: Z92.3

## 2018-02-24 ENCOUNTER — Ambulatory Visit
Admission: RE | Admit: 2018-02-24 | Discharge: 2018-02-24 | Disposition: A | Discharge status: Home / Self Care | Payer: Medicare Other | Source: Ambulatory Visit | Attending: Transplant Surgery | Admitting: Transplant Surgery

## 2018-02-24 DIAGNOSIS — Z01818 Encounter for other preprocedural examination: Secondary | ICD-10-CM | POA: Diagnosis not present

## 2018-02-24 DIAGNOSIS — K861 Other chronic pancreatitis: Secondary | ICD-10-CM | POA: Diagnosis present

## 2018-02-24 DIAGNOSIS — K859 Acute pancreatitis without necrosis or infection, unspecified: Secondary | ICD-10-CM | POA: Diagnosis not present

## 2018-02-24 LAB — POCT I-STAT CREATININE: Creatinine, Ser: 0.4 mg/dL — ABNORMAL LOW (ref 0.44–1.00)

## 2018-02-24 MED ORDER — IOPAMIDOL (ISOVUE-300) INJECTION 61%
100.0000 mL | Freq: Once | INTRAVENOUS | Status: AC | PRN
  Administered 2018-02-24: 100 mL via INTRAVENOUS

## 2018-12-22 ENCOUNTER — Other Ambulatory Visit: Payer: Self-pay | Admitting: Physical Medicine and Rehabilitation

## 2018-12-22 DIAGNOSIS — M5416 Radiculopathy, lumbar region: Secondary | ICD-10-CM

## 2019-05-01 ENCOUNTER — Other Ambulatory Visit: Payer: Self-pay | Admitting: Primary Care

## 2019-05-01 DIAGNOSIS — Z1231 Encounter for screening mammogram for malignant neoplasm of breast: Secondary | ICD-10-CM

## 2020-03-12 ENCOUNTER — Other Ambulatory Visit: Payer: Self-pay | Admitting: Gastroenterology

## 2020-03-12 DIAGNOSIS — K861 Other chronic pancreatitis: Secondary | ICD-10-CM

## 2020-03-12 DIAGNOSIS — R634 Abnormal weight loss: Secondary | ICD-10-CM

## 2020-04-01 ENCOUNTER — Ambulatory Visit
Admission: RE | Admit: 2020-04-01 | Discharge: 2020-04-01 | Disposition: A | Discharge status: Home / Self Care | Payer: Medicare Other | Source: Ambulatory Visit | Attending: Gastroenterology | Admitting: Gastroenterology

## 2020-04-01 ENCOUNTER — Other Ambulatory Visit: Payer: Self-pay

## 2020-04-01 DIAGNOSIS — K861 Other chronic pancreatitis: Secondary | ICD-10-CM

## 2020-04-01 DIAGNOSIS — R634 Abnormal weight loss: Secondary | ICD-10-CM | POA: Diagnosis present

## 2020-04-01 MED ORDER — GADOBUTROL 1 MMOL/ML IV SOLN
5.0000 mL | Freq: Once | INTRAVENOUS | Status: AC | PRN
  Administered 2020-04-01: 5 mL via INTRAVENOUS

## 2020-04-17 ENCOUNTER — Telehealth: Payer: Self-pay

## 2020-04-17 NOTE — Telephone Encounter (Signed)
Received referral from [facility] to have EUS performed for chronic pancreatitis. Case sent for review as directed. Voicemail left with Catherine Krueger to return call for scheduling.

## 2020-04-21 ENCOUNTER — Other Ambulatory Visit: Payer: Self-pay

## 2020-04-21 ENCOUNTER — Telehealth: Payer: Self-pay

## 2020-04-21 NOTE — Telephone Encounter (Signed)
Called and spoke with Catherine Krueger regarding EUS. EUS scheduled for 05/22/20 with Dr. Chevis Pretty at [facility]. [place] over all instructions and printed copy mailed to home address. Instructions can be located under letters. Denies anticoagulation use. Pre-procedure covid test scheduled.

## 2020-05-19 ENCOUNTER — Other Ambulatory Visit: Payer: Self-pay

## 2020-05-19 ENCOUNTER — Telehealth: Payer: Self-pay

## 2020-05-19 NOTE — Telephone Encounter (Signed)
Received call from Catherine Krueger requesting to reschedule her EUS on 05/22/20. Next available date is 06/05/20. She would like EUS rescheduled to this date. Rescheduled to 06/05/20. Instructions reviewed and COVID testing rescheduled to 06/03/20. Provided direct phone number for any further questions or needs.

## 2020-05-20 ENCOUNTER — Other Ambulatory Visit: Payer: Medicare Other

## 2020-05-26 ENCOUNTER — Telehealth: Payer: Self-pay

## 2020-05-26 NOTE — Telephone Encounter (Signed)
Received notification from Dr. Lafonda Mosses office that EUS cases for 06/05/20 will need to be rescheduled. Called and notified Ms. Zelada. She can be rescheduled for 06/19/20 at [facility]. If she would like to have EUS sooner she will need to contact [facility] for outside scheduling. She states she prefers to have EUS local. Rescheduled to 06/19/20. New instructions mailed to home address. Encouraged to call with any new questions or concerns.

## 2020-05-29 ENCOUNTER — Telehealth: Payer: Self-pay | Admitting: Pharmacy Technician

## 2020-05-29 NOTE — Telephone Encounter (Signed)
Created in error

## 2020-06-03 ENCOUNTER — Other Ambulatory Visit: Payer: Medicare Other

## 2020-06-17 ENCOUNTER — Other Ambulatory Visit: Payer: Self-pay

## 2020-06-17 ENCOUNTER — Other Ambulatory Visit
Admission: RE | Admit: 2020-06-17 | Discharge: 2020-06-17 | Disposition: A | Discharge status: Home / Self Care | Payer: Medicare HMO | Source: Ambulatory Visit | Attending: Gastroenterology | Admitting: Gastroenterology

## 2020-06-17 DIAGNOSIS — Z01812 Encounter for preprocedural laboratory examination: Secondary | ICD-10-CM | POA: Diagnosis present

## 2020-06-17 DIAGNOSIS — Z20822 Contact with and (suspected) exposure to covid-19: Secondary | ICD-10-CM | POA: Diagnosis not present

## 2020-06-17 LAB — SARS CORONAVIRUS 2 (TAT 6-24 HRS): SARS Coronavirus 2: NEGATIVE

## 2020-06-19 ENCOUNTER — Ambulatory Visit
Admission: RE | Admit: 2020-06-19 | Discharge: 2020-06-19 | Disposition: A | Discharge status: Home / Self Care | Payer: Medicare HMO | Attending: Internal Medicine | Admitting: Internal Medicine

## 2020-06-19 ENCOUNTER — Ambulatory Visit: Payer: Medicare HMO | Admitting: Anesthesiology

## 2020-06-19 ENCOUNTER — Encounter: Payer: Self-pay | Admitting: Internal Medicine

## 2020-06-19 ENCOUNTER — Encounter
Admission: RE | Disposition: A | Discharge status: Home / Self Care | Payer: Self-pay | Source: Home / Self Care | Attending: Internal Medicine

## 2020-06-19 ENCOUNTER — Other Ambulatory Visit: Payer: Self-pay

## 2020-06-19 DIAGNOSIS — Z7984 Long term (current) use of oral hypoglycemic drugs: Secondary | ICD-10-CM | POA: Diagnosis not present

## 2020-06-19 DIAGNOSIS — K8689 Other specified diseases of pancreas: Secondary | ICD-10-CM | POA: Insufficient documentation

## 2020-06-19 DIAGNOSIS — Z794 Long term (current) use of insulin: Secondary | ICD-10-CM | POA: Diagnosis not present

## 2020-06-19 DIAGNOSIS — Z79899 Other long term (current) drug therapy: Secondary | ICD-10-CM | POA: Insufficient documentation

## 2020-06-19 DIAGNOSIS — Z9221 Personal history of antineoplastic chemotherapy: Secondary | ICD-10-CM | POA: Insufficient documentation

## 2020-06-19 DIAGNOSIS — F17210 Nicotine dependence, cigarettes, uncomplicated: Secondary | ICD-10-CM | POA: Diagnosis not present

## 2020-06-19 DIAGNOSIS — Z923 Personal history of irradiation: Secondary | ICD-10-CM | POA: Insufficient documentation

## 2020-06-19 DIAGNOSIS — Z8541 Personal history of malignant neoplasm of cervix uteri: Secondary | ICD-10-CM | POA: Diagnosis not present

## 2020-06-19 DIAGNOSIS — K862 Cyst of pancreas: Secondary | ICD-10-CM | POA: Diagnosis present

## 2020-06-19 HISTORY — DX: Sleep apnea, unspecified: G47.30

## 2020-06-19 HISTORY — PX: EUS: SHX5427

## 2020-06-19 HISTORY — DX: Presence of insulin pump (external) (internal): Z96.41

## 2020-06-19 HISTORY — DX: Other chronic pancreatitis: K86.1

## 2020-06-19 LAB — GLUCOSE, CAPILLARY: Glucose-Capillary: 137 mg/dL — ABNORMAL HIGH (ref 70–99)

## 2020-06-19 SURGERY — ULTRASOUND, UPPER GI TRACT, ENDOSCOPIC
Anesthesia: General

## 2020-06-19 MED ORDER — PHENYLEPHRINE HCL (PRESSORS) 10 MG/ML IV SOLN
INTRAVENOUS | Status: DC | PRN
  Administered 2020-06-19: 100 ug via INTRAVENOUS

## 2020-06-19 MED ORDER — LIDOCAINE HCL (PF) 2 % IJ SOLN
INTRAMUSCULAR | Status: DC | PRN
  Administered 2020-06-19: 100 mg via INTRADERMAL

## 2020-06-19 MED ORDER — PROPOFOL 500 MG/50ML IV EMUL
INTRAVENOUS | Status: DC | PRN
  Administered 2020-06-19: 200 ug/kg/min via INTRAVENOUS

## 2020-06-19 MED ORDER — ONDANSETRON HCL 4 MG/2ML IJ SOLN
INTRAMUSCULAR | Status: DC | PRN
  Administered 2020-06-19: 4 mg via INTRAVENOUS

## 2020-06-19 MED ORDER — SODIUM CHLORIDE 0.9 % IV SOLN: INTRAVENOUS | Status: DC

## 2020-06-19 MED ORDER — PROPOFOL 10 MG/ML IV BOLUS
INTRAVENOUS | Status: DC | PRN
  Administered 2020-06-19: 60 mg via INTRAVENOUS

## 2020-06-19 NOTE — H&P (Signed)
ID: 55 yo female presenting for an EUS in setting of unexplained pancreatitis and weight loss.  Past Medical History:  Diagnosis Date  . Cervical cancer (HCC)    remission, dx at age 69  . Depression   . Diabetes mellitus, type II ([place])   . GERD (gastroesophageal reflux disease)   . H. pylori infection 2013  . HLD (hyperlipidemia)   . Hypertension         Patient Active Problem List   Diagnosis Date Noted  . Diabetes mellitus type 2, uncomplicated ([place]) 92/42/6834  . 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 Hypokalemia 02/24/2015  . Hyponatremia 02/24/2015  . Leukocytosis 02/24/2015  . Anemia 02/24/2015  . Obesity 02/24/2015  . Dehydration 02/24/2015  . Tobacco abuse 02/24/2015  . DKA (diabetic ketoacidoses) ([place]) 02/22/2015  . Depression 02/22/2015  . HLD (hyperlipidemia) 02/22/2015  . HTN (hypertension) 02/22/2015  . GERD (gastroesophageal reflux disease) 02/22/2015  . UTI (lower urinary tract infection) 02/22/2015  . AKI (acute kidney injury) ([place]) 02/22/2015         Past Surgical History:  Procedure Laterality Date  . ABDOMINAL HYSTERECTOMY    . CHOLECYSTECTOMY             Prior to Admission medications   Medication Sig Start Date End Date Taking? Authorizing Provider  atorvastatin (LIPITOR) 40 MG tablet  11/04/16   [provider]  clotrimazole (LOTRIMIN) 1 % cream  12/31/16   [provider]  famotidine (PEPCID) 20 MG tablet Take 1 tablet (20 mg total) by mouth 2 (two) times daily. 01/28/17 01/28/18  Darel Hong, MD  glucose blood test strip Use as instructed 12/03/16   Max Sane, MD  ibuprofen (ADVIL,MOTRIN) 400 MG tablet Take 1 tablet (400 mg total) by mouth every 6 (six) hours as needed. 12/03/16   Max Sane, MD  insulin aspart (NOVOLOG) 100 UNIT/ML injection 4 units TID with each meals 12/03/16 12/03/17  Max Sane, MD  Insulin Glargine (LANTUS SOLOSTAR) 100 UNIT/ML Solostar Pen Inject 10  Units into the skin daily at 10 pm. 12/03/16   Max Sane, MD  Insulin Pen Needle 31G X 5 MM MISC 30 application by Does not apply route 3 (three) times daily after meals. 12/03/16   Max Sane, MD  Lancets (FREESTYLE) lancets Use as instructed 12/03/16   Max Sane, MD  metFORMIN (GLUCOPHAGE) 1000 MG tablet Take 1 tablet (1,000 mg total) by mouth 2 (two) times daily with a meal. 12/03/16 12/03/17  Max Sane, MD  omeprazole (PRILOSEC) 40 MG capsule  11/04/16   [provider]  ondansetron (ZOFRAN) 4 MG tablet  11/04/16   [provider]  oxyCODONE-acetaminophen (PERCOCET/ROXICET) 5-325 MG tablet Take 1-2 tablets by mouth every 6 (six) hours as needed for severe pain. 09/18/17   Nena Polio, MD  promethazine (PHENERGAN) 25 MG tablet Take 1 tablet (25 mg total) by mouth every 6 (six) hours as needed for nausea or vomiting. 09/18/17   Nena Polio, MD  sucralfate (CARAFATE) 1 g tablet  11/04/16   [provider]    Allergies Diclofenac sodium and Keflex [cephalexin]       Family History  Problem Relation Age of Onset  . Diabetes Mother   . Heart attack Father   . Heart attack Brother   . Diabetes Brother     Social History Social History        Tobacco Use  . Smoking status: Current Every Day Smoker  Packs/day: 1.00    Types: Cigarettes  . Smokeless tobacco: Never Used  Substance Use Topics  . Alcohol use: No    Alcohol/week: 0.0 oz  . Drug use: No    Physical examination: Vital signs stable Mental: A&O x 4 Abd: soft, NT, ND, + BS   A/P: Patient is suitable to proceed with an endoscopic ultrasound as planned.

## 2020-06-19 NOTE — Anesthesia Postprocedure Evaluation (Signed)
Anesthesia Post Note  Patient: Catherine Krueger  Procedure(s) Performed: FULL UPPER ENDOSCOPIC ULTRASOUND (EUS) RADIAL (N/A )  Patient location during evaluation: Endoscopy Anesthesia Type: General Level of consciousness: awake and alert Pain management: pain level controlled Vital Signs Assessment: post-procedure vital signs reviewed and stable Respiratory status: spontaneous breathing, nonlabored ventilation and respiratory function stable Cardiovascular status: blood pressure returned to baseline and stable Postop Assessment: no apparent nausea or vomiting Anesthetic complications: no   No complications documented.   Last Vitals:  Vitals:   06/19/20 0856 06/19/20 0906  BP: 113/64 125/66  Pulse: 63 64  Resp: 14 14  Temp:    SpO2: 100% 100%    Last Pain:  Vitals:   06/19/20 0906  TempSrc:   PainSc: 0-No pain                 Alphonsus Sias

## 2020-06-19 NOTE — Anesthesia Preprocedure Evaluation (Addendum)
Anesthesia Evaluation  Patient identified by MRN, date of birth, ID band Patient awake    Reviewed: Allergy & Precautions, H&P , NPO status , Patient's Chart, lab work & pertinent test results  History of Anesthesia Complications (+) PONV  Airway Mallampati: II  TM Distance: >3 FB     Dental  (+) Edentulous Lower, Edentulous Upper   Pulmonary sleep apnea , neg COPD, Current Smoker and Patient abstained from smoking.,    breath sounds clear to auscultation       Cardiovascular hypertension, (-) angina(-) Past MI and (-) Cardiac Stents (-) dysrhythmias  Rhythm:regular Rate:Normal     Neuro/Psych PSYCHIATRIC DISORDERS Depression negative neurological ROS     GI/Hepatic Neg liver ROS, GERD  Controlled,  Endo/Other  diabetes  Renal/GU negative Renal ROS  negative genitourinary   Musculoskeletal   Abdominal   Peds  Hematology negative hematology ROS (+)   Anesthesia Other Findings Past Medical History: No date: Cervical cancer ([place])     Comment:  remission, dx at age 27 No date: Chronic pancreatitis ([place]) No date: Depression No date: Diabetes mellitus, type II ([place]) No date: GERD (gastroesophageal reflux disease) 2013: H. pylori infection No date: HLD (hyperlipidemia) No date: Hypertension No date: Insulin pump in place No date: Personal history of chemotherapy No date: Personal history of radiation therapy No date: Sleep apnea  Past Surgical History: No date: ABDOMINAL HYSTERECTOMY No date: CHOLECYSTECTOMY No date: COLONOSCOPY No date: OOPHORECTOMY No date: UPPER GI ENDOSCOPY     Reproductive/Obstetrics negative OB ROS                           Anesthesia Physical Anesthesia Plan  ASA: III  Anesthesia Plan: General   Post-op Pain Management:    Induction:   PONV Risk Score and Plan: Propofol infusion and TIVA  Airway Management Planned:   Additional Equipment:    Intra-op Plan:   Post-operative Plan:   Informed Consent: I have reviewed the patients History and Physical, chart, labs and discussed the procedure including the risks, benefits and alternatives for the proposed anesthesia with the patient or authorized representative who has indicated his/her understanding and acceptance.     Dental Advisory Given  Plan Discussed with: Anesthesiologist, CRNA and Surgeon  Anesthesia Plan Comments:         Anesthesia Quick Evaluation

## 2020-06-19 NOTE — Transfer of Care (Signed)
Immediate Anesthesia Transfer of Care Note  Patient: Catherine Krueger  Procedure(s) Performed: FULL UPPER ENDOSCOPIC ULTRASOUND (EUS) RADIAL (N/A )  Patient Location: PACU  Anesthesia Type:General  Level of Consciousness: sedated  Airway & Oxygen Therapy: Patient Spontanous Breathing and Patient connected to nasal cannula oxygen  Post-op Assessment: Report given to RN and Post -op Vital signs reviewed and stable  Post vital signs: Reviewed and stable  Last Vitals:  Vitals Value Taken Time  BP 103/57 06/19/20 0847  Temp    Pulse 56 06/19/20 0848  Resp 20 06/19/20 0848  SpO2 100 % 06/19/20 0848  Vitals shown include unvalidated device data.  Last Pain:  Vitals:   06/19/20 0814  TempSrc: Temporal         Complications: No complications documented.

## 2020-06-19 NOTE — Op Note (Signed)
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 Providers:             Murray Hodgkins. [place] Referring MD:          Cambell j. [facility], dr (Referring MD),                         Lilla Shook. [place], PA (Referring MD) Medicines:             Propofol per Anesthesia Complications:         No immediate complications. Procedure:             Pre-Anesthesia Assessment:                        Prior to the procedure, a History and Physical was                         performed, and patient medications and allergies were                         reviewed. The patient is competent. The risks and                         benefits of the procedure and the sedation options and                         risks were discussed with the patient. All questions                         were answered and informed consent was obtained.                         Patient identification and proposed procedure were                         verified by the physician, the nurse and the                         anesthesiologist in the pre-procedure area. Mental                         Status Examination: alert and oriented. Airway                         Examination: normal oropharyngeal airway and neck  mobility. Respiratory Examination: clear to                         auscultation. CV Examination: normal. Prophylactic                         Antibiotics: The patient does not  require prophylactic                         antibiotics. Prior Anticoagulants: The patient has                         taken no previous anticoagulant or antiplatelet                         agents. ASA Grade Assessment: III - A patient with                         severe systemic disease. After reviewing the risks and                         benefits, the patient was deemed in satisfactory                         condition to undergo the procedure. The anesthesia                         plan was to use monitored anesthesia care (MAC).                         Immediately prior to administration of medications,                         the patient was re-assessed for adequacy to receive                         sedatives. The heart rate, respiratory rate, oxygen                         saturations, blood pressure, adequacy of pulmonary                         ventilation, and response to care were monitored                         throughout the procedure. The physical status of the                         patient was re-assessed after the procedure.                        After obtaining informed consent, the endoscope was                         passed under direct vision. Throughout the procedure,                         the patient's blood pressure, pulse, and oxygen  saturations were monitored continuously.The upper EUS                         was accomplished without difficulty. The patient                         tolerated the procedure well. The Duodenoscope was                         introduced through the mouth, and advanced to the                         second part of duodenum. The Linear Echoendoscope was                         introduced through the mouth, and advanced to the                         duodenum for ultrasound examination from the                         esophagus, stomach and duodenum. Findings:      ENDOSCOPIC FINDING: :      The entire  examined stomach was endoscopically normal.      The ampulla, duodenal bulb, first portion of the duodenum and second       portion of the duodenum were normal.      ENDOSONOGRAPHIC FINDING: :      Pancreatic parenchymal abnormalities were noted in the pancreatic head,       genu of the pancreas, pancreatic body and pancreatic tail. These       consisted of calcifications, hyperechoic strands, hyperechoic foci and       shadowing foci.      The pancreatic duct had intraductal stones, had an irregularly contoured       endosonographic appearance and had a tortuous/ectatic appearance in the       main pancreatic duct. The PD measured 3.4 mm in the head and 1.4 mm in       the tail.      There was no sign of significant endosonographic abnormality in the       common bile duct (1.7 mm) and in the common hepatic duct (3.7 mm).      Endosonographic imaging in the left lobe of the liver showed no       abnormalities.      The celiac region was visualized and showed no sign of significant       endosonographic abnormality. Impression:            EGD Impressions:                        - The esophagus was not well visualized with the                         duodenoscope.                        - Normal stomach.                        - Normal ampulla, duodenal bulb, first portion of the  duodenum and second portion of the duodenum.                        EUS Impressions:                        - Pancreatic parenchymal abnormalities consisting of                         calcifications, hyperechoic strands, hyperechoic foci                         and shadowing foci were noted in the pancreatic head,                         genu of the pancreas, pancreatic body and pancreatic                         tail. Along with the duct findings, these changes are                         consistent with a diagnosis of chronic pancreatitis                         per [place] criteria.  No mass or cystic lesions seen.                         However, the presence of heavy calcifications with                         resultant shadowing artifact in the pancreas makes                         visualization of mass lesions difficult to see on EUS.                        - The pancreatic duct had intraductal stones, had an                         irregularly contoured endosonographic appearance and                         had a tortuous/ectatic appearance in the main                         pancreatic duct.                        - There was no sign of significant pathology in the                         common bile duct and in the common hepatic duct.                        - Normal visualized portions of the liver.                        - Normal celiac region.                        -  No specimens collected. Recommendation:        - Discharge patient to home (ambulatory).                        - The findings and recommendations were discussed with                         the patient and her daughter.                        - Return to referring physician as previously                         scheduled. Further plan of care to be determined by                         the referring physicians. Procedure Code(s):     --- Professional ---                        [phone], Esophagogastroduodenoscopy, flexible,                         transoral; with endoscopic ultrasound examination                         limited to the esophagus, stomach or duodenum, and                         adjacent structures Diagnosis Code(s):     --- Professional ---                        R93.2, Abnormal findings on diagnostic imaging of                         liver and biliary tract                        K86.89, Other specified diseases of pancreas                        R93.3, Abnormal findings on diagnostic imaging of                         other parts of digestive tract                         K86.9, Disease of pancreas, unspecified CPT copyright 2019 [employer] Association. All rights reserved. The codes documented in this report are preliminary and upon coder review may  be revised to meet current compliance requirements. Attending Participation:      I personally performed the entire procedure without the assistance of a       fellow, resident or surgical assistant. [place],  06/19/2020 8:52:22 AM This report has been signed electronically. Number of Addenda: 0 Note Initiated On: 06/19/2020 7:28 AM Estimated Blood Loss:  Estimated blood loss: none.      [facility]

## 2020-06-19 NOTE — Discharge Instructions (Signed)
Discharge to home °

## 2020-06-19 NOTE — Anesthesia Procedure Notes (Signed)
Date/Time: 06/19/2020 8:30 AM Performed by: Nelda Marseille, CRNA Pre-anesthesia Checklist: Patient identified, Emergency Drugs available, Suction available, Patient being monitored and Timeout performed

## 2020-06-20 ENCOUNTER — Encounter: Payer: Self-pay | Admitting: Internal Medicine

## 2020-12-25 ENCOUNTER — Other Ambulatory Visit: Payer: Self-pay | Admitting: Primary Care

## 2021-01-13 ENCOUNTER — Other Ambulatory Visit: Payer: Self-pay | Admitting: Primary Care

## 2021-01-13 DIAGNOSIS — Z1231 Encounter for screening mammogram for malignant neoplasm of breast: Secondary | ICD-10-CM

## 2021-01-30 ENCOUNTER — Other Ambulatory Visit: Payer: Self-pay

## 2021-01-30 ENCOUNTER — Ambulatory Visit
Admission: RE | Admit: 2021-01-30 | Discharge: 2021-01-30 | Disposition: A | Discharge status: Home / Self Care | Payer: Medicare Other | Source: Ambulatory Visit | Attending: Primary Care | Admitting: Primary Care

## 2021-01-30 DIAGNOSIS — Z1231 Encounter for screening mammogram for malignant neoplasm of breast: Secondary | ICD-10-CM | POA: Insufficient documentation

## 2021-07-29 ENCOUNTER — Encounter: Payer: Self-pay | Admitting: Gastroenterology

## 2021-07-29 NOTE — H&P (Signed)
? ?Pre-Procedure H&P ?  ?Patient ID: Catherine Krueger is a 56 y.o. female. ? ?Gastroenterology Provider: Annamaria Helling, DO ? ?Referring Provider: Octavia Bruckner, PA ?PCP: [facility] [place] ? ?Date: 07/30/2021 ? ?HPI ?[name] is a 56 y.o. female who presents today for Esophagogastroduodenoscopy for Worsening diarrhea, dysphagia. Unfortunately, she has poor colonoscopy prep so only EGD will be performed today ? ?Patient with history of DM2 with insulin pump, chronic pancreatitis with pancreatic insufficiency, status postcholecystectomy who is being evaluated for worsening diarrhea as well as dysphagia. ? ?Patient notes worsening diarrhea with 2-4 bowel movements per day.  These range from formed to loose.  She has noted increasingly foul smell associated with this.  She is on Creon for her pancreatic insufficiency which has helped in the past and recently increased dose.  She does have to wear diapers due to loss of stool per report.  No melena or hematochezia. ? ?She has also noticed some suprasternal sticking specifically with dry foods and pills.  No dysphagia to liquids. No other upper GI symptoms.  Denies odynophagia.  Weight and appetite stable. ? ?She is also status post hysterectomy ? ?Underwent endoscopy in 2019 for pancreatic stent removal.  EUS was in February 2022 demonstrating chronic pancreatitis ? ?Underwent EGD and colonoscopy in May 2015 demonstrating tortuous colon and internal hemorrhoids with suboptimal prep and recommended follow-up.  EGD demonstrated gastritis and esophagitis LA grade B.  Negative for Barrett's esophagus and H. pylori.   ? ?Past Medical History:  ?Diagnosis Date  ? Arthritis   ? Cervical cancer ([place])   ? remission, dx at age 63  ? Chronic pancreatitis ([place])   ? Depression   ? Diabetes mellitus, type II ([place])   ? GERD (gastroesophageal reflux disease)   ? H. pylori infection 2013  ? HLD (hyperlipidemia)   ? Hypertension   ? Insulin pump in  place   ? Peripheral neuropathy due to chemotherapy [facility])   ? Personal history of chemotherapy   ? Personal history of radiation therapy   ? Sleep apnea   ? ? ?Past Surgical History:  ?Procedure Laterality Date  ? ABDOMINAL HYSTERECTOMY    ? CHOLECYSTECTOMY    ? COLONOSCOPY    ? EUS N/A 06/19/2020  ? Procedure: FULL UPPER ENDOSCOPIC ULTRASOUND (EUS) RADIAL;  Surgeon: Burbridge, Murray Hodgkins, MD;  Location: [facility] ENDOSCOPY;  Service: Endoscopy;  Laterality: N/A;  ? OOPHORECTOMY    ? UPPER GI ENDOSCOPY    ? ? ?Family History ?No h/o GI disease or malignancy ? ?Review of Systems  ?Constitutional:  Negative for activity change, appetite change, chills, diaphoresis, fatigue, fever and unexpected weight change.  ?HENT:  Positive for trouble swallowing. Negative for voice change.   ?Respiratory:  Negative for shortness of breath and wheezing.   ?Cardiovascular:  Negative for chest pain, palpitations and leg swelling.  ?Gastrointestinal:  Positive for diarrhea. Negative for abdominal distention, abdominal pain, anal bleeding, blood in stool, constipation, nausea, rectal pain and vomiting.  ?Musculoskeletal:  Negative for arthralgias and myalgias.  ?Skin:  Negative for color change and pallor.  ?Neurological:  Negative for dizziness, syncope and weakness.  ?Psychiatric/Behavioral:  Negative for confusion.   ?All other systems reviewed and are negative.  ? ?Medications ?No current facility-administered medications on file prior to encounter.  ? ?Current Outpatient Medications on File Prior to Encounter  ?Medication Sig Dispense Refill  ? atorvastatin (LIPITOR) 40 MG tablet     ? CHOLECALCIFEROL PO Take  1,000 Units by mouth daily.    ? DULoxetine (CYMBALTA) 60 MG capsule Take 60 mg by mouth daily.    ? INSULIN LISPRO IJ Inject 65 Units as directed.    ? lipase/protease/amylase (CREON) 36000 UNITS CPEP capsule Take by mouth 3 (three) times daily before meals.    ? lisinopril (ZESTRIL) 40 MG tablet Take 40 mg by mouth daily.    ?  meloxicam (MOBIC) 7.5 MG tablet Take 7.5 mg by mouth daily.    ? Multiple Vitamins-Minerals (MULTIVITAMIN GUMMIES ADULT PO) Take by mouth daily.    ? pantoprazole (PROTONIX) 40 MG tablet Take 40 mg by mouth daily.    ? pregabalin (LYRICA) 50 MG capsule Take 50 mg by mouth 2 (two) times daily.    ? tiZANidine (ZANAFLEX) 4 MG tablet Take 4 mg by mouth at bedtime.    ? clotrimazole (LOTRIMIN) 1 % cream     ? famotidine (PEPCID) 20 MG tablet Take 1 tablet (20 mg total) by mouth 2 (two) times daily. 60 tablet 0  ? glucose blood test strip Use as instructed 100 each 1  ? ibuprofen (ADVIL,MOTRIN) 400 MG tablet Take 1 tablet (400 mg total) by mouth every 6 (six) hours as needed. 30 tablet 0  ? insulin aspart (NOVOLOG) 100 UNIT/ML injection 4 units TID with each meals 10 mL 3  ? Insulin Glargine (LANTUS SOLOSTAR) 100 UNIT/ML Solostar Pen Inject 10 Units into the skin daily at 10 pm. 15 mL 1  ? Insulin Pen Needle 31G X 5 MM MISC 30 application by Does not apply route 3 (three) times daily after meals. 90 each 0  ? Lancets (FREESTYLE) lancets Use as instructed 100 each 1  ? metFORMIN (GLUCOPHAGE) 1000 MG tablet Take 1 tablet (1,000 mg total) by mouth 2 (two) times daily with a meal. 60 tablet 1  ? omeprazole (PRILOSEC) 40 MG capsule     ? ondansetron (ZOFRAN) 4 MG tablet     ? oxyCODONE-acetaminophen (PERCOCET/ROXICET) 5-325 MG tablet Take 1-2 tablets by mouth every 6 (six) hours as needed for severe pain. 30 tablet 0  ? promethazine (PHENERGAN) 25 MG tablet Take 1 tablet (25 mg total) by mouth every 6 (six) hours as needed for nausea or vomiting. 30 tablet 0  ? sucralfate (CARAFATE) 1 g tablet     ? ? ?Pertinent medications related to GI and procedure were reviewed by me with the patient prior to the procedure ? ? ?Current Facility-Administered Medications:  ?  0.9 %  sodium chloride infusion, , Intravenous, Continuous, Annamaria Helling, DO, Last Rate: 20 mL/hr at 07/30/21 0800, New Bag at 07/30/21 0800 ?  ?   ? ?Allergies  ?Allergen Reactions  ? Diclofenac Sodium Other (See Comments)  ? Keflex [Cephalexin] Palpitations  ? ?Allergies were reviewed by me prior to the procedure ? ?Objective  ? ?Body mass index is 22.31 kg/m?. ?Vitals:  ? 07/30/21 0748  ?BP: (!) 141/66  ?Pulse: 69  ?Resp: 16  ?Temp: (!) 97 ?F (36.1 ?C)  ?TempSrc: Temporal  ?SpO2: 100%  ?Weight: 55.3 kg  ?Height: '5\' 2"'$  (1.575 m)  ? ? ? ?Physical Exam ?Vitals and nursing note reviewed.  ?Constitutional:   ?   General: She is not in acute distress. ?   Appearance: Normal appearance. She is not ill-appearing, toxic-appearing or diaphoretic.  ?HENT:  ?   Head: Normocephalic and atraumatic.  ?   Nose: Nose normal.  ?   Mouth/Throat:  ?   Mouth: Mucous  membranes are moist.  ?   Pharynx: Oropharynx is clear.  ?   Comments: edentulous ?Eyes:  ?   General: No scleral icterus. ?   Extraocular Movements: Extraocular movements intact.  ?Cardiovascular:  ?   Rate and Rhythm: Normal rate and regular rhythm.  ?   Heart sounds: Normal heart sounds. No murmur heard. ?  No friction rub. No gallop.  ?Pulmonary:  ?   Effort: Pulmonary effort is normal. No respiratory distress.  ?   Breath sounds: Normal breath sounds. No wheezing, rhonchi or rales.  ?Abdominal:  ?   General: Bowel sounds are normal. There is no distension.  ?   Palpations: Abdomen is soft.  ?   Tenderness: There is no abdominal tenderness. There is no guarding or rebound.  ?Musculoskeletal:  ?   Cervical back: Neck supple.  ?   Right lower leg: No edema.  ?   Left lower leg: No edema.  ?Skin: ?   General: Skin is warm and dry.  ?   Coloration: Skin is not jaundiced or pale.  ?Neurological:  ?   Mental Status: She is alert and oriented to person, place, and time. Mental status is at baseline.  ?Psychiatric:     ?   Mood and Affect: Mood normal.     ?   Judgment: Judgment normal.  ? ? ? ?Assessment:  ?[name] is a 56 y.o. female  who presents today for Esophagogastroduodenoscopy or Worsening  diarrhea, dysphagia. ? ?Plan:  ?Esophagogastroduodenoscopy with possible intervention today ? ?Esophagogastroduodenoscopy with possible biopsy, control of bleeding, polypectomy, and interventions as necessary has been discussed

## 2021-07-30 ENCOUNTER — Ambulatory Visit: Payer: Medicare Other | Admitting: Registered Nurse

## 2021-07-30 ENCOUNTER — Ambulatory Visit
Admission: RE | Admit: 2021-07-30 | Discharge: 2021-07-30 | Disposition: A | Discharge status: Home / Self Care | Payer: Medicare Other | Source: Ambulatory Visit | Attending: Gastroenterology | Admitting: Gastroenterology

## 2021-07-30 ENCOUNTER — Other Ambulatory Visit: Payer: Self-pay

## 2021-07-30 ENCOUNTER — Encounter
Admission: RE | Disposition: A | Discharge status: Home / Self Care | Payer: Self-pay | Source: Ambulatory Visit | Attending: Gastroenterology

## 2021-07-30 ENCOUNTER — Encounter: Payer: Self-pay | Admitting: Gastroenterology

## 2021-07-30 DIAGNOSIS — Z9049 Acquired absence of other specified parts of digestive tract: Secondary | ICD-10-CM | POA: Insufficient documentation

## 2021-07-30 DIAGNOSIS — Z8541 Personal history of malignant neoplasm of cervix uteri: Secondary | ICD-10-CM | POA: Insufficient documentation

## 2021-07-30 DIAGNOSIS — E785 Hyperlipidemia, unspecified: Secondary | ICD-10-CM | POA: Insufficient documentation

## 2021-07-30 DIAGNOSIS — Z923 Personal history of irradiation: Secondary | ICD-10-CM | POA: Insufficient documentation

## 2021-07-30 DIAGNOSIS — Z9641 Presence of insulin pump (external) (internal): Secondary | ICD-10-CM | POA: Insufficient documentation

## 2021-07-30 DIAGNOSIS — K2100 Gastro-esophageal reflux disease with esophagitis, without bleeding: Secondary | ICD-10-CM | POA: Diagnosis not present

## 2021-07-30 DIAGNOSIS — K8689 Other specified diseases of pancreas: Secondary | ICD-10-CM | POA: Diagnosis not present

## 2021-07-30 DIAGNOSIS — R197 Diarrhea, unspecified: Secondary | ICD-10-CM | POA: Insufficient documentation

## 2021-07-30 DIAGNOSIS — K2980 Duodenitis without bleeding: Secondary | ICD-10-CM | POA: Insufficient documentation

## 2021-07-30 DIAGNOSIS — Z79899 Other long term (current) drug therapy: Secondary | ICD-10-CM | POA: Diagnosis not present

## 2021-07-30 DIAGNOSIS — F17200 Nicotine dependence, unspecified, uncomplicated: Secondary | ICD-10-CM | POA: Diagnosis not present

## 2021-07-30 DIAGNOSIS — I10 Essential (primary) hypertension: Secondary | ICD-10-CM | POA: Insufficient documentation

## 2021-07-30 DIAGNOSIS — T451X5A Adverse effect of antineoplastic and immunosuppressive drugs, initial encounter: Secondary | ICD-10-CM | POA: Insufficient documentation

## 2021-07-30 DIAGNOSIS — K861 Other chronic pancreatitis: Secondary | ICD-10-CM | POA: Insufficient documentation

## 2021-07-30 DIAGNOSIS — K2970 Gastritis, unspecified, without bleeding: Secondary | ICD-10-CM | POA: Diagnosis not present

## 2021-07-30 DIAGNOSIS — Z8619 Personal history of other infectious and parasitic diseases: Secondary | ICD-10-CM | POA: Diagnosis not present

## 2021-07-30 DIAGNOSIS — E119 Type 2 diabetes mellitus without complications: Secondary | ICD-10-CM | POA: Insufficient documentation

## 2021-07-30 DIAGNOSIS — G4730 Sleep apnea, unspecified: Secondary | ICD-10-CM | POA: Insufficient documentation

## 2021-07-30 DIAGNOSIS — F32A Depression, unspecified: Secondary | ICD-10-CM | POA: Insufficient documentation

## 2021-07-30 DIAGNOSIS — G620 Drug-induced polyneuropathy: Secondary | ICD-10-CM | POA: Insufficient documentation

## 2021-07-30 DIAGNOSIS — R1310 Dysphagia, unspecified: Secondary | ICD-10-CM | POA: Insufficient documentation

## 2021-07-30 DIAGNOSIS — K224 Dyskinesia of esophagus: Secondary | ICD-10-CM | POA: Insufficient documentation

## 2021-07-30 DIAGNOSIS — Z9221 Personal history of antineoplastic chemotherapy: Secondary | ICD-10-CM | POA: Diagnosis not present

## 2021-07-30 DIAGNOSIS — K219 Gastro-esophageal reflux disease without esophagitis: Secondary | ICD-10-CM | POA: Diagnosis present

## 2021-07-30 DIAGNOSIS — Z794 Long term (current) use of insulin: Secondary | ICD-10-CM | POA: Diagnosis not present

## 2021-07-30 HISTORY — DX: Drug-induced polyneuropathy: T45.1X5A

## 2021-07-30 HISTORY — DX: Drug-induced polyneuropathy: G62.0

## 2021-07-30 HISTORY — PX: ESOPHAGOGASTRODUODENOSCOPY (EGD) WITH PROPOFOL: SHX5813

## 2021-07-30 HISTORY — DX: Unspecified osteoarthritis, unspecified site: M19.90

## 2021-07-30 LAB — GLUCOSE, CAPILLARY: Glucose-Capillary: 130 mg/dL — ABNORMAL HIGH (ref 70–99)

## 2021-07-30 SURGERY — ESOPHAGOGASTRODUODENOSCOPY (EGD) WITH PROPOFOL
Anesthesia: General

## 2021-07-30 MED ORDER — PROPOFOL 500 MG/50ML IV EMUL
INTRAVENOUS | Status: DC | PRN
Start: 2021-07-30 — End: 2021-07-30
  Administered 2021-07-30: 150 ug/kg/min via INTRAVENOUS

## 2021-07-30 MED ORDER — DEXMEDETOMIDINE HCL 200 MCG/2ML IV SOLN
INTRAVENOUS | Status: DC | PRN
  Administered 2021-07-30: 12 ug via INTRAVENOUS

## 2021-07-30 MED ORDER — LIDOCAINE HCL (CARDIAC) PF 100 MG/5ML IV SOSY
PREFILLED_SYRINGE | INTRAVENOUS | Status: DC | PRN
  Administered 2021-07-30: 100 mg via INTRAVENOUS

## 2021-07-30 MED ORDER — SODIUM CHLORIDE 0.9 % IV SOLN: INTRAVENOUS | Status: DC

## 2021-07-30 MED ORDER — EPHEDRINE SULFATE (PRESSORS) 50 MG/ML IJ SOLN
INTRAMUSCULAR | Status: DC | PRN
Start: 2021-07-30 — End: 2021-07-30
  Administered 2021-07-30: 10 mg via INTRAVENOUS

## 2021-07-30 MED ORDER — PROPOFOL 10 MG/ML IV BOLUS
INTRAVENOUS | Status: DC | PRN
  Administered 2021-07-30 (×2): 20 mg via INTRAVENOUS
  Administered 2021-07-30: 10 mg via INTRAVENOUS
  Administered 2021-07-30: 20 mg via INTRAVENOUS
  Administered 2021-07-30: 50 mg via INTRAVENOUS

## 2021-07-30 NOTE — Op Note (Signed)
[facility] ?Gastroenterology ?Patient Name: Catherine Krueger ?Procedure Date: 07/30/2021 8:13 AM ?MRN: [id number] ?Account #: [id number] ?Date of Birth: 11-27-1965 ?Admit Type: Outpatient ?Age: 56 ?Room: [facility] ENDO ROOM 1 ?Gender: Female ?Note Status: Finalized ?Instrument Name: Upper Endoscope [id number] ?Procedure:             Upper GI endoscopy ?Indications:           Gastro-esophageal reflux disease, Diarrhea ?Providers:             Annamaria Helling DO, DO ?Referring MD:          [facility], dr (Referring MD) ?Medicines:             Monitored Anesthesia Care ?Complications:         No immediate complications. Estimated blood loss:  ?                       Minimal. ?Procedure:             Pre-Anesthesia Assessment: ?                       - Prior to the procedure, a History and Physical was  ?                       performed, and patient medications and allergies were  ?                       reviewed. The patient is competent. The risks and  ?                       benefits of the procedure and the sedation options and  ?                       risks were discussed with the patient. All questions  ?                       were answered and informed consent was obtained.  ?                       Patient identification and proposed procedure were  ?                       verified by the physician, the nurse, the anesthetist  ?                       and the technician in the endoscopy suite. Mental  ?                       Status Examination: alert and oriented. Airway  ?                       Examination: normal oropharyngeal airway and neck  ?                       mobility. Respiratory Examination: clear to  ?                       auscultation. CV Examination: RRR, no murmurs, no S3  ?  or S4. Prophylactic Antibiotics: The patient does not  ?                       require prophylactic antibiotics. Prior  ?                       Anticoagulants: The patient  has taken no previous  ?                       anticoagulant or antiplatelet agents. ASA Grade  ?                       Assessment: III - A patient with severe systemic  ?                       disease. After reviewing the risks and benefits, the  ?                       patient was deemed in satisfactory condition to  ?                       undergo the procedure. The anesthesia plan was to use  ?                       monitored anesthesia care (MAC). Immediately prior to  ?                       administration of medications, the patient was  ?                       re-assessed for adequacy to receive sedatives. The  ?                       heart rate, respiratory rate, oxygen saturations,  ?                       blood pressure, adequacy of pulmonary ventilation, and  ?                       response to care were monitored throughout the  ?                       procedure. The physical status of the patient was  ?                       re-assessed after the procedure. ?                       After obtaining informed consent, the endoscope was  ?                       passed under direct vision. Throughout the procedure,  ?                       the patient's blood pressure, pulse, and oxygen  ?                       saturations were monitored continuously. The  ?  Endosonoscope was introduced through the mouth, and  ?                       advanced to the second part of duodenum. The upper GI  ?                       endoscopy was accomplished without difficulty. The  ?                       patient tolerated the procedure well. ?Findings: ?     The duodenal bulb, first portion of the duodenum and second portion of  ?     the duodenum were normal. Biopsies for histology were taken with a cold  ?     forceps for evaluation of celiac disease. Estimated blood loss was  ?     minimal. ?     Diffuse moderate inflammation characterized by erosions, erythema and  ?     granularity was found in the  entire examined stomach. Biopsies were  ?     taken with a cold forceps for Helicobacter pylori testing. Estimated  ?     blood loss was minimal. ?     The Z-line was irregular. Small island of salmon colored mucosa. C0M1.  ?     Biopsies were taken with a cold forceps for histology. Estimated blood  ?     loss was minimal. ?     Esophagogastric landmarks were identified: the gastroesophageal junction  ?     was found at 35 cm from the incisors. ?     Abnormal motility was noted in the esophagus. There is spasticity of the  ?     esophageal body. The distal esophagus/lower esophageal sphincter is  ?     open. Tertiary peristaltic waves are noted. Estimated blood loss: none. ?     Normal mucosa was found in the entire esophagus. Biopsies were taken  ?     with a cold forceps for histology. From mid esophagus to rule out EOE.  ?     The scope was withdrawn. Dilation was performed with a Maloney dilator  ?     with no resistance at 52 Fr. The dilation site was examined following  ?     endoscope reinsertion and showed no change. Estimated blood loss: none. ?     The exam was otherwise without abnormality. ?Impression:            - Normal duodenal bulb, first portion of the duodenum  ?                       and second portion of the duodenum. Biopsied. ?                       - Gastritis. Biopsied. ?                       - Z-line irregular. Biopsied. ?                       - Esophagogastric landmarks identified. ?                       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 ?                       - Normal mucosa was found in the entire esophagus.  ?                       Biopsied. Dilated. ?                       - The examination was otherwise normal. ?Recommendation:        - Discharge patient to home. ?                       - Resume previous diet. ?                       - Continue present medications. ?                       - No aspirin, ibuprofen, naproxen, or  other  ?                       non-steroidal anti-inflammatory drugs for 5 days after  ?                       biopsy. ?                       - Await pathology results. ?                       - Return to GI clinic as previously scheduled. ?                       - The findings and recommendations were discussed with  ?                       the patient. ?Procedure Code(s):     --- Professional --- ?                       [phone], Esophagogastroduodenoscopy, flexible,  ?                       transoral; with biopsy, single or multiple ?                       43450, Dilation of esophagus, by unguided sound or  ?                       bougie, single or multiple passes ?Diagnosis Code(s):     --- Professional --- ?                       K29.70, Gastritis, unspecified, without bleeding ?                       K22.8, Other specified diseases of esophagus ?                       K22.4, Dyskinesia of esophagus ?                       K21.9, Gastro-esophageal  reflux disease without  ?                       esophagitis ?                       R19.7, Diarrhea, unspecified ?CPT copyright 2019 American Medical Association. All rights reserved. ?The codes documented in this report are preliminary and upon coder review may  ?be revised to meet current compliance requirements. ?Attending Participation: ?     I personally performed the entire procedure. ?Volney American, DO ?[place], DO ?07/30/2021 8:48:16 AM ?This report has been signed electronically. ?Number of Addenda: 0 ?Note Initiated On: 07/30/2021 8:13 AM ?Estimated Blood Loss:  Estimated blood loss was minimal. ?     [facility] ?

## 2021-07-30 NOTE — Transfer of Care (Signed)
Immediate Anesthesia Transfer of Care Note ? ?Patient: Catherine Krueger ? ?Procedure(s) Performed: ESOPHAGOGASTRODUODENOSCOPY (EGD) WITH PROPOFOL ? ?Patient Location: Endoscopy Unit ? ?Anesthesia Type:General ? ?Level of Consciousness: drowsy ? ?Airway & Oxygen Therapy: Patient Spontanous Breathing ? ?Post-op Assessment: Report given to RN and Post -op Vital signs reviewed and stable ? ?Post vital signs: Reviewed and stable ? ?Last Vitals: see Flowsheets ?Vitals Value Taken Time  ?BP    ?Temp    ?Pulse    ?Resp    ?SpO2    ? ? ?Last Pain:  ?Vitals:  ? 07/30/21 0748  ?TempSrc: Temporal  ?PainSc: 0-No pain  ?   ? ?  ? ?Complications: No notable events documented. ?

## 2021-07-30 NOTE — Interval H&P Note (Signed)
History and Physical Interval Note: Preprocedure H&P from 07/30/21 ? was reviewed and there was no interval change after seeing and examining the patient.  Written consent was obtained from the patient after discussion of risks, benefits, and alternatives. Patient has consented to proceed with Esophagogastroduodenoscopy with possible intervention ? ? ?07/30/2021 ?8:19 AM ? ?Catherine Krueger  has presented today for surgery, with the diagnosis of Z12.11  - Colon cancer screening ?K21.00  - Gastroesophageal reflux disease with esophagitis without hemorrhage ?R13.14  - Pharyngoesophageal dysphagia.  The various methods of treatment have been discussed with the patient and family. After consideration of risks, benefits and other options for treatment, the patient has consented to  Procedure(s): ?ESOPHAGOGASTRODUODENOSCOPY (EGD) WITH PROPOFOL (N/A) as a surgical intervention.  The patient's history has been reviewed, patient examined, no change in status, stable for surgery.  I have reviewed the patient's chart and labs.  Questions were answered to the patient's satisfaction.   ? ? ?Annamaria Helling ? ? ?

## 2021-07-30 NOTE — Anesthesia Preprocedure Evaluation (Signed)
Anesthesia Evaluation  ?Patient identified by MRN, date of birth, ID band ?Patient awake ? ? ? ?Reviewed: ?Allergy & Precautions, NPO status , Patient's Chart, lab work & pertinent test results ? ?Airway ?Mallampati: III ? ?TM Distance: >3 FB ?Neck ROM: full ? ? ? Dental ? ?(+) Edentulous Upper, Edentulous Lower ?  ?Pulmonary ?neg pulmonary ROS, Current Smoker and Patient abstained from smoking.,  ?  ?Pulmonary exam normal ? ? ? ? ? ? ? Cardiovascular ?hypertension, Normal cardiovascular exam ? ? ?  ?Neuro/Psych ?PSYCHIATRIC DISORDERS Depression  Neuromuscular disease   ? GI/Hepatic ?Neg liver ROS, GERD  Medicated,  ?Endo/Other  ?negative endocrine ROSdiabetes, Type 1, Insulin Dependent ? Renal/GU ?negative Renal ROS  ?negative genitourinary ?  ?Musculoskeletal ? ?(+) Arthritis ,  ? Abdominal ?Normal abdominal exam  (+)   ?Peds ? Hematology ?negative hematology ROS ?(+)   ?Anesthesia Other Findings ?Past Medical History: ?No date: Arthritis ?No date: Cervical cancer ([place]) ?    Comment:  remission, dx at age 31 ?No date: Chronic pancreatitis ([place]) ?No date: Depression ?No date: Diabetes mellitus, type II ([place]) ?No date: GERD (gastroesophageal reflux disease) ?2013: H. pylori infection ?No date: HLD (hyperlipidemia) ?No date: Hypertension ?No date: Insulin pump in place ?No date: Peripheral neuropathy due to chemotherapy [facility]) ?No date: Personal history of chemotherapy ?No date: Personal history of radiation therapy ?No date: Sleep apnea ? ?Past Surgical History: ?No date: ABDOMINAL HYSTERECTOMY ?No date: CHOLECYSTECTOMY ?No date: COLONOSCOPY ?06/19/2020: EUS; N/A ?    Comment:  Procedure: FULL UPPER ENDOSCOPIC ULTRASOUND (EUS)  ?             RADIAL;  Surgeon: Burbridge, Murray Hodgkins, MD;  Location:  ?             [facility];  Service: Endoscopy;  Laterality: N/A; ?No date: OOPHORECTOMY ?No date: UPPER GI ENDOSCOPY ? ?BMI   ? Body Mass Index: 22.31 kg/m?  ?  ? ?  Reproductive/Obstetrics ?negative OB ROS ? ?  ? ? ? ? ? ? ? ? ? ? ? ? ? ?  ?  ? ? ? ? ? ? ? ? ?Anesthesia Physical ?Anesthesia Plan ? ?ASA: 3 ? ?Anesthesia Plan: General  ? ?Post-op Pain Management: Minimal or no pain anticipated  ? ?Induction: Intravenous ? ?PONV Risk Score and Plan: Propofol infusion and TIVA ? ?Airway Management Planned: Natural Airway and Simple Face Mask ? ?Additional Equipment:  ? ?Intra-op Plan:  ? ?Post-operative Plan:  ? ?Informed Consent: I have reviewed the patients History and Physical, chart, labs and discussed the procedure including the risks, benefits and alternatives for the proposed anesthesia with the patient or authorized representative who has indicated his/her understanding and acceptance.  ? ? ? ?Dental Advisory Given ? ?Plan Discussed with: Anesthesiologist, CRNA and Surgeon ? ?Anesthesia Plan Comments:   ? ? ? ? ? ? ?Anesthesia Quick Evaluation ? ?

## 2021-07-30 NOTE — OR Nursing (Signed)
PT NOT CLEAN FOR COLONOSCOPY . STOOL DARK BROWN. PT REPORTS SHE KNEW PREP DID NOT WORK BECAUSE SHE HAD LIMITED STOOLS. DR RUSSO INFORMED. MD REPORTS PROCEED WITH UPPER EXAM ?

## 2021-07-31 LAB — SURGICAL PATHOLOGY

## 2021-08-01 NOTE — Anesthesia Postprocedure Evaluation (Signed)
Anesthesia Post Note ? ?Patient: CRISTIANA YOCHIM ? ?Procedure(s) Performed: ESOPHAGOGASTRODUODENOSCOPY (EGD) WITH PROPOFOL ? ?Patient location during evaluation: Endoscopy ?Anesthesia Type: General ?Level of consciousness: awake and alert ?Pain management: pain level controlled ?Vital Signs Assessment: post-procedure vital signs reviewed and stable ?Respiratory status: spontaneous breathing, nonlabored ventilation and respiratory function stable ?Cardiovascular status: blood pressure returned to baseline and stable ?Postop Assessment: no apparent nausea or vomiting ?Anesthetic complications: no ? ? ?No notable events documented. ? ? ?Last Vitals:  ?Vitals:  ? 07/30/21 0853 07/30/21 0913  ?BP: (!) 106/49 (!) 141/55  ?Pulse: 78 66  ?Resp: 20 15  ?Temp:  (!) 35.7 ?C  ?SpO2: 100% 100%  ?  ?Last Pain:  ?Vitals:  ? 07/31/21 0747  ?TempSrc:   ?PainSc: 0-No pain  ? ? ?  ?  ?  ?  ?  ?  ? ?[place] Ouch ? ? ? ? ?

## 2021-08-03 ENCOUNTER — Encounter: Payer: Self-pay | Admitting: Gastroenterology

## 2021-09-02 ENCOUNTER — Inpatient Hospital Stay: Payer: Medicare Other

## 2021-09-02 ENCOUNTER — Emergency Department: Payer: Medicare Other

## 2021-09-02 ENCOUNTER — Other Ambulatory Visit: Payer: Self-pay

## 2021-09-02 ENCOUNTER — Inpatient Hospital Stay
Admission: EM | Admit: 2021-09-02 | Discharge: 2021-09-04 | DRG: 389 | Disposition: A | Discharge status: Home / Self Care | Payer: Medicare Other | Attending: Osteopathic Medicine | Admitting: Osteopathic Medicine

## 2021-09-02 DIAGNOSIS — Z791 Long term (current) use of non-steroidal anti-inflammatories (NSAID): Secondary | ICD-10-CM | POA: Diagnosis not present

## 2021-09-02 DIAGNOSIS — I10 Essential (primary) hypertension: Secondary | ICD-10-CM | POA: Diagnosis present

## 2021-09-02 DIAGNOSIS — Z7984 Long term (current) use of oral hypoglycemic drugs: Secondary | ICD-10-CM

## 2021-09-02 DIAGNOSIS — F32A Depression, unspecified: Secondary | ICD-10-CM | POA: Diagnosis present

## 2021-09-02 DIAGNOSIS — Z794 Long term (current) use of insulin: Secondary | ICD-10-CM | POA: Diagnosis not present

## 2021-09-02 DIAGNOSIS — Z923 Personal history of irradiation: Secondary | ICD-10-CM

## 2021-09-02 DIAGNOSIS — Z881 Allergy status to other antibiotic agents status: Secondary | ICD-10-CM

## 2021-09-02 DIAGNOSIS — Z9221 Personal history of antineoplastic chemotherapy: Secondary | ICD-10-CM

## 2021-09-02 DIAGNOSIS — F17210 Nicotine dependence, cigarettes, uncomplicated: Secondary | ICD-10-CM | POA: Diagnosis present

## 2021-09-02 DIAGNOSIS — E785 Hyperlipidemia, unspecified: Secondary | ICD-10-CM | POA: Diagnosis present

## 2021-09-02 DIAGNOSIS — R0602 Shortness of breath: Secondary | ICD-10-CM | POA: Diagnosis present

## 2021-09-02 DIAGNOSIS — K56609 Unspecified intestinal obstruction, unspecified as to partial versus complete obstruction: Secondary | ICD-10-CM | POA: Diagnosis present

## 2021-09-02 DIAGNOSIS — Z9641 Presence of insulin pump (external) (internal): Secondary | ICD-10-CM | POA: Diagnosis present

## 2021-09-02 DIAGNOSIS — R8290 Unspecified abnormal findings in urine: Secondary | ICD-10-CM

## 2021-09-02 DIAGNOSIS — K219 Gastro-esophageal reflux disease without esophagitis: Secondary | ICD-10-CM | POA: Diagnosis present

## 2021-09-02 DIAGNOSIS — Z20822 Contact with and (suspected) exposure to covid-19: Secondary | ICD-10-CM | POA: Diagnosis present

## 2021-09-02 DIAGNOSIS — E876 Hypokalemia: Secondary | ICD-10-CM | POA: Diagnosis present

## 2021-09-02 DIAGNOSIS — K565 Intestinal adhesions [bands], unspecified as to partial versus complete obstruction: Principal | ICD-10-CM | POA: Diagnosis present

## 2021-09-02 DIAGNOSIS — E119 Type 2 diabetes mellitus without complications: Secondary | ICD-10-CM

## 2021-09-02 DIAGNOSIS — G4733 Obstructive sleep apnea (adult) (pediatric): Secondary | ICD-10-CM | POA: Diagnosis present

## 2021-09-02 DIAGNOSIS — K861 Other chronic pancreatitis: Secondary | ICD-10-CM | POA: Diagnosis present

## 2021-09-02 DIAGNOSIS — Z8541 Personal history of malignant neoplasm of cervix uteri: Secondary | ICD-10-CM

## 2021-09-02 DIAGNOSIS — Z8249 Family history of ischemic heart disease and other diseases of the circulatory system: Secondary | ICD-10-CM | POA: Diagnosis not present

## 2021-09-02 DIAGNOSIS — Z79899 Other long term (current) drug therapy: Secondary | ICD-10-CM

## 2021-09-02 DIAGNOSIS — D72829 Elevated white blood cell count, unspecified: Secondary | ICD-10-CM | POA: Diagnosis present

## 2021-09-02 DIAGNOSIS — Z833 Family history of diabetes mellitus: Secondary | ICD-10-CM | POA: Diagnosis not present

## 2021-09-02 LAB — LIPASE, BLOOD: Lipase: 21 U/L (ref 11–51)

## 2021-09-02 LAB — RESP PANEL BY RT-PCR (FLU A&B, COVID) ARPGX2
Influenza A by PCR: NEGATIVE
Influenza B by PCR: NEGATIVE
SARS Coronavirus 2 by RT PCR: NEGATIVE

## 2021-09-02 LAB — LACTIC ACID, PLASMA
Lactic Acid, Venous: 0.9 mmol/L (ref 0.5–1.9)
Lactic Acid, Venous: 1 mmol/L (ref 0.5–1.9)

## 2021-09-02 LAB — CBC
HCT: 41.8 % (ref 36.0–46.0)
Hemoglobin: 13.4 g/dL (ref 12.0–15.0)
MCH: 29.7 pg (ref 26.0–34.0)
MCHC: 32.1 g/dL (ref 30.0–36.0)
MCV: 92.7 fL (ref 80.0–100.0)
Platelets: 322 10*3/uL (ref 150–400)
RBC: 4.51 MIL/uL (ref 3.87–5.11)
RDW: 12.8 % (ref 11.5–15.5)
WBC: 13.3 10*3/uL — ABNORMAL HIGH (ref 4.0–10.5)
nRBC: 0 % (ref 0.0–0.2)

## 2021-09-02 LAB — COMPREHENSIVE METABOLIC PANEL
ALT: 29 U/L (ref 0–44)
AST: 25 U/L (ref 15–41)
Albumin: 4.1 g/dL (ref 3.5–5.0)
Alkaline Phosphatase: 84 U/L (ref 38–126)
Anion gap: 11 (ref 5–15)
BUN: 10 mg/dL (ref 6–20)
CO2: 27 mmol/L (ref 22–32)
Calcium: 9.8 mg/dL (ref 8.9–10.3)
Chloride: 99 mmol/L (ref 98–111)
Creatinine, Ser: 0.76 mg/dL (ref 0.44–1.00)
GFR, Estimated: >60 mL/min (ref >60)
Glucose, Bld: 200 mg/dL — ABNORMAL HIGH (ref 70–99)
Potassium: 3.9 mmol/L (ref 3.5–5.1)
Sodium: 137 mmol/L (ref 135–145)
Total Bilirubin: 0.9 mg/dL (ref 0.3–1.2)
Total Protein: 7.9 g/dL (ref 6.5–8.1)

## 2021-09-02 LAB — GLUCOSE, CAPILLARY
Glucose-Capillary: 129 mg/dL — ABNORMAL HIGH (ref 70–99)
Glucose-Capillary: 118 mg/dL — ABNORMAL HIGH (ref 70–99)

## 2021-09-02 MED ORDER — ONDANSETRON HCL 4 MG PO TABS: 4.0000 mg | ORAL_TABLET | Freq: Four times a day (QID) | ORAL | Status: DC | PRN

## 2021-09-02 MED ORDER — SODIUM CHLORIDE 0.9 % IV BOLUS
1000.0000 mL | Freq: Once | INTRAVENOUS | Status: AC
Start: 2021-09-02 — End: 2021-09-02
  Administered 2021-09-02: 1000 mL via INTRAVENOUS

## 2021-09-02 MED ORDER — MORPHINE SULFATE (PF) 4 MG/ML IV SOLN
4.0000 mg | Freq: Once | INTRAVENOUS | Status: AC
  Administered 2021-09-02: 4 mg via INTRAVENOUS
  Filled 2021-09-02: qty 1

## 2021-09-02 MED ORDER — IOHEXOL 300 MG/ML  SOLN
100.0000 mL | Freq: Once | INTRAMUSCULAR | Status: AC | PRN
  Administered 2021-09-02: 100 mL via INTRAVENOUS

## 2021-09-02 MED ORDER — MORPHINE SULFATE (PF) 2 MG/ML IV SOLN
2.0000 mg | INTRAVENOUS | Status: DC | PRN
  Administered 2021-09-02: 2 mg via INTRAVENOUS
  Filled 2021-09-02: qty 1

## 2021-09-02 MED ORDER — SODIUM CHLORIDE 0.9 % IV SOLN: INTRAVENOUS | Status: AC

## 2021-09-02 MED ORDER — ONDANSETRON HCL 4 MG/2ML IJ SOLN
4.0000 mg | Freq: Once | INTRAMUSCULAR | Status: AC
  Administered 2021-09-02: 4 mg via INTRAVENOUS
  Filled 2021-09-02: qty 2

## 2021-09-02 MED ORDER — INSULIN ASPART 100 UNIT/ML IJ SOLN: 0.0000 [IU] | Freq: Three times a day (TID) | INTRAMUSCULAR | Status: DC

## 2021-09-02 MED ORDER — HEPARIN SODIUM (PORCINE) 5000 UNIT/ML IJ SOLN
5000.0000 [IU] | Freq: Three times a day (TID) | INTRAMUSCULAR | Status: DC
  Administered 2021-09-02 – 2021-09-03 (×3): 5000 [IU] via SUBCUTANEOUS
  Filled 2021-09-02 (×4): qty 1

## 2021-09-02 MED ORDER — SODIUM CHLORIDE 0.9% FLUSH
3.0000 mL | Freq: Two times a day (BID) | INTRAVENOUS | Status: DC
  Administered 2021-09-02: 3 mL via INTRAVENOUS

## 2021-09-02 MED ORDER — ONDANSETRON HCL 4 MG/2ML IJ SOLN
4.0000 mg | Freq: Four times a day (QID) | INTRAMUSCULAR | Status: DC | PRN
Start: 2021-09-02 — End: 2021-09-04
  Administered 2021-09-02: 4 mg via INTRAVENOUS
  Filled 2021-09-02: qty 2

## 2021-09-02 MED ORDER — ACETAMINOPHEN 10 MG/ML IV SOLN
1000.0000 mg | Freq: Four times a day (QID) | INTRAVENOUS | Status: AC | PRN
  Filled 2021-09-02: qty 100

## 2021-09-02 NOTE — ED Provider Notes (Signed)
? ?[facility] ?Provider Note ? ? ? Event Date/Time  ? First MD Initiated Contact with Patient 09/02/21 1316   ?  (approximate) ? ?History  ? ?Chief Complaint: Abdominal Pain ? ?HPI ? ?Catherine Krueger is a 56 y.o. female with a past medical history of diabetes, hypertension, hyperlipidemia, presents emergency department for mid abdominal pain.  According to the patient since yesterday she has been experiencing mid abdominal pain somewhat worse on the left side.  Patient has been nauseated with occasional episodes of vomiting.  No diarrhea.  No fever.  No urinary symptoms. ? ?Physical Exam  ? ?Triage Vital Signs: ?ED Triage Vitals  ?Enc Vitals Group  ?   BP 09/02/21 1319 (!) 175/67  ?   Pulse Rate 09/02/21 1319 76  ?   Resp 09/02/21 1319 (!) 21  ?   Temp 09/02/21 1319 98 ?F (36.7 ?C)  ?   Temp Source 09/02/21 1319 Oral  ?   SpO2 09/02/21 1319 100 %  ?   Weight 09/02/21 1321 122 lb (55.3 kg)  ?   Height 09/02/21 1321 '5\' 3"'$  (1.6 m)  ?   Head Circumference --   ?   Peak Flow --   ?   Pain Score 09/02/21 1321 5  ?   Pain Loc --   ?   Pain Edu? --   ?   Excl. in [place]? --   ? ? ?Most recent vital signs: ?Vitals:  ? 09/02/21 1319  ?BP: (!) 175/67  ?Pulse: 76  ?Resp: (!) 21  ?Temp: 98 ?F (36.7 ?C)  ?SpO2: 100%  ? ? ?General: Awake, no distress.  ?CV:  Good peripheral perfusion.  Regular rate and rhythm  ?Resp:  Normal effort.  Equal breath sounds bilaterally.  ?Abd:  Soft, mild tenderness palpation in the left lower suprapubic and right lower quadrants.  No focal area of tenderness identified.  No rebound guarding or distention. ? ? ?ED Results / Procedures / Treatments  ? ?EKG ? ?EKG viewed and interpreted by myself shows a normal sinus rhythm at 73 bpm with a narrow QRS, normal axis, normal intervals, no concerning ST changes. ? ?RADIOLOGY ? ?I personally reviewed the CT images patient appears to have dilated small bowel. ?Radiology is read the CT scan as a small bowel obstruction with a transition  zone in the distal ileum.  Chronic pancreatitis ? ? ?MEDICATIONS ORDERED IN ED: ?Medications  ?sodium chloride 0.9 % bolus 1,000 mL (has no administration in time range)  ?morphine (PF) 4 MG/ML injection 4 mg (has no administration in time range)  ?ondansetron (ZOFRAN) injection 4 mg (has no administration in time range)  ? ? ? ?IMPRESSION / MDM / ASSESSMENT AND PLAN / ED COURSE  ?I reviewed the triage vital signs and the nursing notes. ? ?Patient presents to the emergency department for abdominal pain since yesterday along with nausea vomiting.  Differential is quite broad but would include gastritis, gastroenteritis, colitis, diverticulitis, UTI or pyelonephritis.  We will check labs, IV hydrate treat pain and nausea while awaiting results.  We will likely proceed with CT imaging of abdomen/pelvis to further evaluate.  Patient agreeable to plan of care. ? ? ?Patient's lab work shows slight leukocytosis of 13,000, otherwise normal CBC, reassuring chemistry including renal function.  Lipase is normal.  Urinalysis and CT scan pending. ? ?CT scan shows small bowel obstruction with a transition zone in the distal ileum.  Patient states she has previously had a cholecystectomy  as well as a hysterectomy.  Given the transition zone we will discuss with surgery for further treatment.  We will have an NG tube placed ? ?FINAL CLINICAL IMPRESSION(S) / ED DIAGNOSES  ? ?Abdominal pain ?Small bowel obstruction ? ?Note:  This document was prepared using Dragon voice recognition software and may include unintentional dictation errors. ?  Harvest Dark, MD ?09/03/21 2151 ? ?

## 2021-09-02 NOTE — ED Notes (Signed)
Informed RN bed assigned 

## 2021-09-02 NOTE — H&P (Signed)
?History and Physical  ? ? ?AVAGRACE BOTELHO PPJ:[id number] DOB: 1965/06/25 DOA: 09/02/2021 ? ?[facility]P: [facility] [place]  ?Patient coming from: home ? ?I have personally briefly reviewed patient's old medical records in [place] ? ?Chief Complaint: abdominal pain n/v ? ?HPI: Catherine Krueger is a 56 y.o. female with medical history significant of DMII, chronic pancreatitis, depression, HLD, HTN, OSA, peripheral neuropathy who presents with ED with abdominal pain and n/v x 1 day. She notes no associated diarrhea,or fever but noted chills. Patient states pain in lower abdomen R>L.  ON further evaluation patient found to have  sbo  with transition zone on CT scan of abdomen and pelvis. At that juncture surgery consulted who noted no need for surgical intervention  and recommended admission to hospitalist service.Per patient her pain is currently improved but still present. Patient denies chest pain,or blood in stools or dysuria.  ? ? ?ED Course:  ?Vitals: ?Afeb, bp 175/67, rr 21, hr 76 sat 100% on ra  ?Wbc: 13.3, hgb 13.4, plt 322 ?Ekg:nsr no st -t wave changes ? ?CT abd  ?IMPRESSION: ?Small-bowel obstruction with transition zone in the distal ileum. ?  ?Chronic pancreatitis changes as seen previously. No evidence of ?acute pancreatitis. ?  ?Review of Systems: As per HPI otherwise 10 point review of systems negative.  ? ?Past Medical History:  ?Diagnosis Date  ? Arthritis   ? Cervical cancer ([place])   ? remission, dx at age 36  ? Chronic pancreatitis ([place])   ? Depression   ? Diabetes mellitus, type II ([place])   ? GERD (gastroesophageal reflux disease)   ? H. pylori infection 2013  ? HLD (hyperlipidemia)   ? Hypertension   ? Insulin pump in place   ? Peripheral neuropathy due to chemotherapy [facility])   ? Personal history of chemotherapy   ? Personal history of radiation therapy   ? Sleep apnea   ? ? ?Past Surgical History:  ?Procedure Laterality Date  ? ABDOMINAL HYSTERECTOMY    ? CHOLECYSTECTOMY    ?  COLONOSCOPY    ? ESOPHAGOGASTRODUODENOSCOPY (EGD) WITH PROPOFOL N/A 07/30/2021  ? Procedure: ESOPHAGOGASTRODUODENOSCOPY (EGD) WITH PROPOFOL;  Surgeon: Annamaria Helling, DO;  Location: [facility] ENDOSCOPY;  Service: Gastroenterology;  Laterality: N/A;  ? EUS N/A 06/19/2020  ? Procedure: FULL UPPER ENDOSCOPIC ULTRASOUND (EUS) RADIAL;  Surgeon: [place], Murray Hodgkins, MD;  Location: [facility] ENDOSCOPY;  Service: Endoscopy;  Laterality: N/A;  ? OOPHORECTOMY    ? UPPER GI ENDOSCOPY    ? ? ? reports that she has been smoking cigarettes. She has been smoking an average of 1 pack per day. She quit smokeless tobacco use about 38 years ago.  Her smokeless tobacco use included chew. She reports that she does not currently use alcohol. She reports that she does not use drugs. ? ?Allergies  ?Allergen Reactions  ? Diclofenac Sodium Other (See Comments)  ? Keflex [Cephalexin] Palpitations  ? ? ?Family History  ?Problem Relation Age of Onset  ? Diabetes Mother   ? Arthritis Mother   ? Heart failure Mother   ? Arthritis Father   ? Heart attack Father   ? Stroke Father   ? Heart attack Brother   ? Diabetes Brother   ? Breast cancer Maternal Grandmother   ? Arthritis Son   ? ? ?Prior to Admission medications   ?Medication Sig Start Date End Date Taking? Authorizing Provider  ?atorvastatin (LIPITOR) 40 MG tablet  11/04/16   [provider]  ?  CHOLECALCIFEROL PO Take 1,000 Units by mouth daily.    [provider]  ?clotrimazole (LOTRIMIN) 1 % cream  12/31/16   [provider]  ?DULoxetine (CYMBALTA) 60 MG capsule Take 60 mg by mouth daily.    [provider]  ?famotidine (PEPCID) 20 MG tablet Take 1 tablet (20 mg total) by mouth 2 (two) times daily. 01/28/17 01/28/18  Darel Hong, MD  ?glucose blood test strip Use as instructed 12/03/16   Max Sane, MD  ?ibuprofen (ADVIL,MOTRIN) 400 MG tablet Take 1 tablet (400 mg total) by mouth every 6 (six) hours as needed. 12/03/16   Max Sane, MD  ?insulin aspart  (NOVOLOG) 100 UNIT/ML injection 4 units TID with each meals 12/03/16 12/03/17  Max Sane, MD  ?Insulin Glargine (LANTUS SOLOSTAR) 100 UNIT/ML Solostar Pen Inject 10 Units into the skin daily at 10 pm. 12/03/16   Max Sane, MD  ?INSULIN LISPRO IJ Inject 65 Units as directed.    [provider]  ?Insulin Pen Needle 31G X 5 MM MISC 30 application by Does not apply route 3 (three) times daily after meals. 12/03/16   Max Sane, MD  ?Lancets (FREESTYLE) lancets Use as instructed 12/03/16   Max Sane, MD  ?lipase/protease/amylase (CREON) 36000 UNITS CPEP capsule Take by mouth 3 (three) times daily before meals.    [provider]  ?lisinopril (ZESTRIL) 40 MG tablet Take 40 mg by mouth daily.    [provider]  ?meloxicam (MOBIC) 7.5 MG tablet Take 7.5 mg by mouth daily.    [provider]  ?metFORMIN (GLUCOPHAGE) 1000 MG tablet Take 1 tablet (1,000 mg total) by mouth 2 (two) times daily with a meal. 12/03/16 12/03/17  Max Sane, MD  ?Multiple Vitamins-Minerals (MULTIVITAMIN GUMMIES ADULT PO) Take by mouth daily.    [provider]  ?omeprazole (PRILOSEC) 40 MG capsule  11/04/16   [provider]  ?ondansetron (ZOFRAN) 4 MG tablet  11/04/16   [provider]  ?oxyCODONE-acetaminophen (PERCOCET/ROXICET) 5-325 MG tablet Take 1-2 tablets by mouth every 6 (six) hours as needed for severe pain. 09/18/17   Nena Polio, MD  ?pantoprazole (PROTONIX) 40 MG tablet Take 40 mg by mouth daily.    [provider]  ?pregabalin (LYRICA) 50 MG capsule Take 50 mg by mouth 2 (two) times daily.    [provider]  ?promethazine (PHENERGAN) 25 MG tablet Take 1 tablet (25 mg total) by mouth every 6 (six) hours as needed for nausea or vomiting. 09/18/17   Nena Polio, MD  ?sucralfate (CARAFATE) 1 g tablet  11/04/16   [provider]  ?tiZANidine (ZANAFLEX) 4 MG tablet Take 4 mg by mouth at bedtime.    [provider]  ? ? ?Physical  Exam: ?Vitals:  ? 09/02/21 1530 09/02/21 1600 09/02/21 1630 09/02/21 1700  ?BP: (!) 143/70 137/76 (!) 125/58 (!) 152/61  ?Pulse: 67 69 65 67  ?Resp: '15 15 15 15  '$ ?Temp:      ?TempSrc:      ?SpO2: 99% 99% 98% 98%  ?Weight:      ?Height:      ? ? ? ?Vitals:  ? 09/02/21 1530 09/02/21 1600 09/02/21 1630 09/02/21 1700  ?BP: (!) 143/70 137/76 (!) 125/58 (!) 152/61  ?Pulse: 67 69 65 67  ?Resp: '15 15 15 15  '$ ?Temp:      ?TempSrc:      ?SpO2: 99% 99% 98% 98%  ?Weight:      ?Height:      ?  Constitutional: NAD, calm, comfortable ?Eyes: PERRL, lids and conjunctivae normal ?ENMT: Mucous membranes are dry. Posterior pharynx clear of any exudate or lesions + dentures  ?Neck: normal, supple, no masses, no thyromegaly ?Respiratory: clear to auscultation bilaterally, no wheezing, no crackles. Normal respiratory effort. No accessory muscle use.  ?Cardiovascular: Regular rate and rhythm, no murmurs / rubs / gallops. No extremity edema. 2+ pedal pulses.Marland Kitchen  ?Abdomen: hypoactive bs, +tenderness, no masses palpated. No hepatosplenomegaly. Soft not distended  ?Musculoskeletal: no clubbing / cyanosis. No joint deformity upper and lower extremities. Good ROM, no contractures. Normal muscle tone.  ?Skin: no rashes, lesions, ulcers. No induration ?Neurologic: CN 2-12 grossly intact. Sensation intact, Strength 5/5 in all 4.  ?Psychiatric: Normal judgment and insight. Alert and oriented x 3. Normal mood.  ? ? ?Labs on Admission: I have personally reviewed following labs and imaging studies ? ?CBC: ?Recent Labs  ?Lab 09/02/21 ?1322  ?WBC 13.3*  ?HGB 13.4  ?HCT 41.8  ?MCV 92.7  ?PLT 322  ? ?Basic Metabolic Panel: ?Recent Labs  ?Lab 09/02/21 ?1322  ?NA 137  ?K 3.9  ?CL 99  ?CO2 27  ?GLUCOSE 200*  ?BUN 10  ?CREATININE 0.76  ?CALCIUM 9.8  ? ?GFR: ?Estimated Creatinine Clearance: 65.7 mL/min (by C-G formula based on SCr of 0.76 mg/dL). ?Liver Function Tests: ?Recent Labs  ?Lab 09/02/21 ?1322  ?AST 25  ?ALT 29  ?ALKPHOS 84  ?BILITOT 0.9  ?PROT 7.9   ?ALBUMIN 4.1  ? ?Recent Labs  ?Lab 09/02/21 ?1322  ?LIPASE 21  ? ?No results for input(s): AMMONIA in the last 168 hours. ?Coagulation Profile: ?No results for input(s): INR, PROTIME in the last 168 hours. ?Cardiac Enzymes: ?No

## 2021-09-02 NOTE — ED Notes (Signed)
Patient repositioned in the bed. Pillows provided for support. Lowered head of bed per pt request, warm blankets provided as well as Lemon glycerin swabs. Lights dimmed for comfort ?

## 2021-09-02 NOTE — Consult Note (Signed)
[facility] ?SURGICAL CONSULTATION NOTE (initial) - cpt: 63785 ? ? ?HISTORY OF PRESENT ILLNESS (HPI):  ?56 y.o. female presented to [facility] ED today for evaluation of upper abdominal pain. Patient reports centralized abdominal pain since yesterday, associated nausea with some vomiting.  She reports the emesis appeared like coffee, denies green discoloration.  She denies any diarrhea.  Denies any fevers or chills.  Prior surgeries include a hysterectomy and a cholecystectomy.  She denies any prior history of small bowel obstruction.  She denies any cramping pain, denies any waxing or waning exacerbations of pain. ?She suffers from chronic pancreatitis and pancreatic insufficiency. ?CT scan obtained in ED shows distal small bowel obstruction with transition of the distal ileum.  No remarkable gastric distention or fluid present. ?Surgery is consulted by ED physician Dr. Cinda Quest in this context for evaluation and management of small bowel obstruction.. ? ?PAST MEDICAL HISTORY (PMH):  ?Past Medical History:  ?Diagnosis Date  ? Arthritis   ? Cervical cancer ([place])   ? remission, dx at age 59  ? Chronic pancreatitis ([place])   ? Depression   ? Diabetes mellitus, type II ([place])   ? GERD (gastroesophageal reflux disease)   ? H. pylori infection 2013  ? HLD (hyperlipidemia)   ? Hypertension   ? Insulin pump in place   ? Peripheral neuropathy due to chemotherapy [facility])   ? Personal history of chemotherapy   ? Personal history of radiation therapy   ? Sleep apnea   ?  ? ?PAST SURGICAL HISTORY ([place]):  ?Past Surgical History:  ?Procedure Laterality Date  ? ABDOMINAL HYSTERECTOMY    ? CHOLECYSTECTOMY    ? COLONOSCOPY    ? ESOPHAGOGASTRODUODENOSCOPY (EGD) WITH PROPOFOL N/A 07/30/2021  ? Procedure: ESOPHAGOGASTRODUODENOSCOPY (EGD) WITH PROPOFOL;  Surgeon: Annamaria Helling, DO;  Location: [facility] ENDOSCOPY;  Service: Gastroenterology;  Laterality: N/A;  ? EUS N/A 06/19/2020  ? Procedure: FULL UPPER ENDOSCOPIC ULTRASOUND  (EUS) RADIAL;  Surgeon: [place], Murray Hodgkins, MD;  Location: [facility] ENDOSCOPY;  Service: Endoscopy;  Laterality: N/A;  ? OOPHORECTOMY    ? UPPER GI ENDOSCOPY    ?  ? ?MEDICATIONS:  ?Prior to Admission medications   ?Medication Sig Start Date End Date Taking? Authorizing Provider  ?atorvastatin (LIPITOR) 40 MG tablet  11/04/16   [provider]  ?CHOLECALCIFEROL PO Take 1,000 Units by mouth daily.    [provider]  ?clotrimazole (LOTRIMIN) 1 % cream  12/31/16   [provider]  ?DULoxetine (CYMBALTA) 60 MG capsule Take 60 mg by mouth daily.    [provider]  ?famotidine (PEPCID) 20 MG tablet Take 1 tablet (20 mg total) by mouth 2 (two) times daily. 01/28/17 01/28/18  Darel Hong, MD  ?glucose blood test strip Use as instructed 12/03/16   Max Sane, MD  ?ibuprofen (ADVIL,MOTRIN) 400 MG tablet Take 1 tablet (400 mg total) by mouth every 6 (six) hours as needed. 12/03/16   Max Sane, MD  ?insulin aspart (NOVOLOG) 100 UNIT/ML injection 4 units TID with each meals 12/03/16 12/03/17  Max Sane, MD  ?Insulin Glargine (LANTUS SOLOSTAR) 100 UNIT/ML Solostar Pen Inject 10 Units into the skin daily at 10 pm. 12/03/16   Max Sane, MD  ?INSULIN LISPRO IJ Inject 65 Units as directed.    [provider]  ?Insulin Pen Needle 31G X 5 MM MISC 30 application by Does not apply route 3 (three) times daily after meals. 12/03/16   Max Sane, MD  ?Lancets (FREESTYLE) lancets Use as instructed 12/03/16  Max Sane, MD  ?lipase/protease/amylase (CREON) 36000 UNITS CPEP capsule Take by mouth 3 (three) times daily before meals.    [provider]  ?lisinopril (ZESTRIL) 40 MG tablet Take 40 mg by mouth daily.    [provider]  ?meloxicam (MOBIC) 7.5 MG tablet Take 7.5 mg by mouth daily.    [provider]  ?metFORMIN (GLUCOPHAGE) 1000 MG tablet Take 1 tablet (1,000 mg total) by mouth 2 (two) times daily with a meal. 12/03/16 12/03/17  Max Sane, MD  ?Multiple  Vitamins-Minerals (MULTIVITAMIN GUMMIES ADULT PO) Take by mouth daily.    [provider]  ?omeprazole (PRILOSEC) 40 MG capsule  11/04/16   [provider]  ?ondansetron (ZOFRAN) 4 MG tablet  11/04/16   [provider]  ?oxyCODONE-acetaminophen (PERCOCET/ROXICET) 5-325 MG tablet Take 1-2 tablets by mouth every 6 (six) hours as needed for severe pain. 09/18/17   Nena Polio, MD  ?pantoprazole (PROTONIX) 40 MG tablet Take 40 mg by mouth daily.    [provider]  ?pregabalin (LYRICA) 50 MG capsule Take 50 mg by mouth 2 (two) times daily.    [provider]  ?promethazine (PHENERGAN) 25 MG tablet Take 1 tablet (25 mg total) by mouth every 6 (six) hours as needed for nausea or vomiting. 09/18/17   Nena Polio, MD  ?sucralfate (CARAFATE) 1 g tablet  11/04/16   [provider]  ?tiZANidine (ZANAFLEX) 4 MG tablet Take 4 mg by mouth at bedtime.    [provider]  ?  ? ?ALLERGIES:  ?Allergies  ?Allergen Reactions  ? Diclofenac Sodium Other (See Comments)  ? Keflex [Cephalexin] Palpitations  ?  ? ?SOCIAL HISTORY:  ?Social History  ? ?Socioeconomic History  ? Marital status: Single  ?  Spouse name: Not on file  ? Number of children: 3  ? Years of education: Not on file  ? Highest education level: Not on file  ?Occupational History  ? Not on file  ?Tobacco Use  ? Smoking status: Every Day  ?  Packs/day: 1.00  ?  Types: Cigarettes  ? Smokeless tobacco: Former  ?  Types: Chew  ?  Quit date: 47  ?Vaping Use  ? Vaping Use: Never used  ?Substance and Sexual Activity  ? Alcohol use: Not Currently  ? Drug use: No  ? Sexual activity: Not Currently  ?Other Topics Concern  ? Not on file  ?Social History Narrative  ? Not on file  ? ?Social Determinants of Health  ? ?Financial Resource Strain: Not on file  ?Food Insecurity: Not on file  ?Transportation Needs: Not on file  ?Physical Activity: Not on file  ?Stress: Not on file  ?Social Connections: Not on file  ?Intimate  Partner Violence: Not on file  ?  ? ?FAMILY HISTORY:  ?Family History  ?Problem Relation Age of Onset  ? Diabetes Mother   ? Arthritis Mother   ? Heart failure Mother   ? Arthritis Father   ? Heart attack Father   ? Stroke Father   ? Heart attack Brother   ? Diabetes Brother   ? Breast cancer Maternal Grandmother   ? Arthritis Son   ?  ? ? ? ? ?VITAL SIGNS:  ?Temp:  [98 ?F (36.7 ?C)] 98 ?F (36.7 ?C) (05/10 1319) ?Pulse Rate:  [65-76] 65 (05/10 1630) ?Resp:  [15-21] 15 (05/10 1630) ?BP: (125-175)/(58-76) 125/58 (05/10 1630) ?SpO2:  [95 %-100 %] 98 % (05/10 1630) ?Weight:  [55.3 kg] 55.3 kg (05/10  1321)     Height: '5\' 3"'$  (160 cm) Weight: 55.3 kg BMI (Calculated): 21.62  ? ?INTAKE/OUTPUT:  ?No intake/output data recorded. ? ?PHYSICAL EXAM:  ?Physical Exam ?Blood pressure (!) 125/58, pulse 65, temperature 98 ?F (36.7 ?C), temperature source Oral, resp. rate 15, height '5\' 3"'$  (1.6 m), weight 55.3 kg, SpO2 98 %. ?Last Weight  Most recent update: 09/02/2021  1:22 PM  ? ? Weight  ?55.3 kg (122 lb)  ?      ? ?  ? ? ?CONSTITUTIONAL: Well developed, and nourished, appropriately responsive and aware without distress.   ?EYES: Sclera non-icteric.   ?EARS, NOSE, MOUTH AND THROAT:  The oropharynx is clear. Oral mucosa is pink and moist.    Hearing is intact to voice.  ?NECK: Trachea is midline, and there is no jugular venous distension.  ?LYMPH NODES:  Lymph nodes in the neck are not enlarged. ?RESPIRATORY:  Lungs are clear, and breath sounds are equal bilaterally. Normal respiratory effort without pathologic use of accessory muscles. ?CARDIOVASCULAR: Heart is regular in rate and rhythm. ?GI: The abdomen is soft, nontender, and nondistended. There were no palpable masses. I did not appreciate hepatosplenomegaly. There were normal bowel sounds. ?MUSCULOSKELETAL:  Symmetrical muscle tone appreciated in all four extremities.    ?SKIN: Skin turgor is normal. No pathologic skin lesions appreciated.  ?NEUROLOGIC:  Motor and sensation  appear grossly normal.  Cranial nerves are grossly without defect. ?PSYCH:  Alert and oriented to person, place and time. Affect is appropriate for situation. ? ?Data Reviewed ?I have personally reviewed what is curre

## 2021-09-02 NOTE — ED Triage Notes (Signed)
Pt presents to ED with c/o of ABD pain, N/V that started yesterday. Pt presents vomiting but NAD noted. Pt  from home, pt is A&Ox4.  ? ?Pt is a type 1 diabetic with functioning insulin pump.  ?

## 2021-09-02 NOTE — ED Provider Notes (Signed)
Discussed patient with Dr. Christian Mate who is now on-call.  He asked me to have the hospitalist admit and place an NG tube.  I have now spoken with the hospitalist as well.  Patient does not have any abdominal pain currently and is not vomiting anymore the nurse has not been out of place NG tube yet.  Possibly we can get by without it right this minute.  I will put the order in however. ?  ?Nena Polio, MD ?09/02/21 1716 ? ?

## 2021-09-03 ENCOUNTER — Inpatient Hospital Stay: Payer: Medicare Other

## 2021-09-03 DIAGNOSIS — E785 Hyperlipidemia, unspecified: Secondary | ICD-10-CM

## 2021-09-03 DIAGNOSIS — I10 Essential (primary) hypertension: Secondary | ICD-10-CM

## 2021-09-03 DIAGNOSIS — D72829 Elevated white blood cell count, unspecified: Secondary | ICD-10-CM

## 2021-09-03 DIAGNOSIS — Z794 Long term (current) use of insulin: Secondary | ICD-10-CM

## 2021-09-03 DIAGNOSIS — F32A Depression, unspecified: Secondary | ICD-10-CM

## 2021-09-03 DIAGNOSIS — K861 Other chronic pancreatitis: Secondary | ICD-10-CM

## 2021-09-03 DIAGNOSIS — E119 Type 2 diabetes mellitus without complications: Secondary | ICD-10-CM

## 2021-09-03 LAB — COMPREHENSIVE METABOLIC PANEL
ALT: 19 U/L (ref 0–44)
AST: 14 U/L — ABNORMAL LOW (ref 15–41)
Albumin: 3 g/dL — ABNORMAL LOW (ref 3.5–5.0)
Alkaline Phosphatase: 61 U/L (ref 38–126)
Anion gap: <0 — ABNORMAL LOW (ref 5–15)
BUN: 12 mg/dL (ref 6–20)
CO2: 29 mmol/L (ref 22–32)
Calcium: 8.1 mg/dL — ABNORMAL LOW (ref 8.9–10.3)
Chloride: 113 mmol/L — ABNORMAL HIGH (ref 98–111)
Creatinine, Ser: 0.62 mg/dL (ref 0.44–1.00)
GFR, Estimated: >60 mL/min (ref >60)
Glucose, Bld: 115 mg/dL — ABNORMAL HIGH (ref 70–99)
Potassium: 3.4 mmol/L — ABNORMAL LOW (ref 3.5–5.1)
Sodium: 141 mmol/L (ref 135–145)
Total Bilirubin: 0.7 mg/dL (ref 0.3–1.2)
Total Protein: 5.9 g/dL — ABNORMAL LOW (ref 6.5–8.1)

## 2021-09-03 LAB — GLUCOSE, CAPILLARY
Glucose-Capillary: 109 mg/dL — ABNORMAL HIGH (ref 70–99)
Glucose-Capillary: 111 mg/dL — ABNORMAL HIGH (ref 70–99)
Glucose-Capillary: 116 mg/dL — ABNORMAL HIGH (ref 70–99)
Glucose-Capillary: 116 mg/dL — ABNORMAL HIGH (ref 70–99)
Glucose-Capillary: 117 mg/dL — ABNORMAL HIGH (ref 70–99)
Glucose-Capillary: 118 mg/dL — ABNORMAL HIGH (ref 70–99)
Glucose-Capillary: 119 mg/dL — ABNORMAL HIGH (ref 70–99)
Glucose-Capillary: 127 mg/dL — ABNORMAL HIGH (ref 70–99)

## 2021-09-03 LAB — CBC
HCT: 34.5 % — ABNORMAL LOW (ref 36.0–46.0)
Hemoglobin: 11.2 g/dL — ABNORMAL LOW (ref 12.0–15.0)
MCH: 30.1 pg (ref 26.0–34.0)
MCHC: 32.5 g/dL (ref 30.0–36.0)
MCV: 92.7 fL (ref 80.0–100.0)
Platelets: 273 10*3/uL (ref 150–400)
RBC: 3.72 MIL/uL — ABNORMAL LOW (ref 3.87–5.11)
RDW: 12.9 % (ref 11.5–15.5)
WBC: 11.3 10*3/uL — ABNORMAL HIGH (ref 4.0–10.5)
nRBC: 0 % (ref 0.0–0.2)

## 2021-09-03 LAB — HEMOGLOBIN A1C
Hgb A1c MFr Bld: 7.1 % — ABNORMAL HIGH (ref 4.8–5.6)
Mean Plasma Glucose: 157.07 mg/dL

## 2021-09-03 LAB — C-REACTIVE PROTEIN: CRP: 0.5 mg/dL (ref <1.0)

## 2021-09-03 LAB — PROCALCITONIN: Procalcitonin: <0.1 ng/mL

## 2021-09-03 LAB — HIV ANTIBODY (ROUTINE TESTING W REFLEX): HIV Screen 4th Generation wRfx: NONREACTIVE

## 2021-09-03 MED ORDER — DEXTROSE 10 % IV SOLN: INTRAVENOUS | Status: DC

## 2021-09-03 MED ORDER — ENOXAPARIN SODIUM 40 MG/0.4ML IJ SOSY
40.0000 mg | PREFILLED_SYRINGE | INTRAMUSCULAR | Status: DC
  Administered 2021-09-03: 40 mg via SUBCUTANEOUS
  Filled 2021-09-03: qty 0.4

## 2021-09-03 MED ORDER — BOOST / RESOURCE BREEZE PO LIQD CUSTOM
1.0000 | Freq: Three times a day (TID) | ORAL | Status: DC
Start: 2021-09-03 — End: 2021-09-04
  Administered 2021-09-04: 1 via ORAL

## 2021-09-03 NOTE — Assessment & Plan Note (Signed)
No active problem ?

## 2021-09-03 NOTE — Assessment & Plan Note (Addendum)
on insulin pump  ?Follow outpatient ?

## 2021-09-03 NOTE — Progress Notes (Signed)
[facility] ?SURGICAL PROGRESS NOTE (cpt [phone]) ? ?Hospital Day(s): 1.  ? ?Interval History: Patient seen and examined, no acute events or new complaints overnight. Patient reports she is feeling better this morning. Minimal, waxing and waning, abdominal pain. No fever, chills, nausea, emesis. Leukocytosis improving; down to 11.3K. Renal function normal; sCr - 0.62; UO - 200 ccs + unmeasured. Mild hypokalemia to 3.4 otherwise no significant electrolyte derangements. KUB pending this morning. She did report flatus and a bowel movement this morning.  ? ?Review of Systems:  ?Constitutional: denies fever, chills  ?HEENT: denies cough or congestion  ?Respiratory: denies any shortness of breath  ?Cardiovascular: denies chest pain or palpitations  ?Gastrointestinal: + abdominal pain (improved); denied N/V ?Genitourinary: denies burning with urination or urinary frequency ?Musculoskeletal: denies pain, decreased motor or sensation ? ?Vital signs in last 24 hours: [min-max] current  ?Temp:  [97.7 ?F (36.5 ?C)-98.2 ?F (36.8 ?C)] 98.2 ?F (36.8 ?C) (05/11 6314) ?Pulse Rate:  [63-92] 66 (05/11 0737) ?Resp:  [15-21] 19 (05/11 0737) ?BP: (115-175)/(53-76) 115/53 (05/11 0737) ?SpO2:  [95 %-100 %] 98 % (05/11 0737) ?Weight:  [55.3 kg] 55.3 kg (05/10 1321)     Height: '5\' 3"'$  (160 cm) Weight: 55.3 kg BMI (Calculated): 21.62  ? ?Intake/Output last 2 shifts:  ?05/10 0701 - 05/11 0700 ?In: 1421.7 [I.V.:421.7; IV Piggyback:1000] ?Out: 200 [Urine:200]  ? ?Physical Exam:  ?Constitutional: alert, cooperative and no distress  ?HENT: normocephalic without obvious abnormality  ?Eyes: PERRL, EOM's grossly intact and symmetric  ?Respiratory: breathing non-labored at rest  ?Cardiovascular: regular rate and sinus rhythm  ?Gastrointestinal: soft, non-tender, and non-distended, no rebound/guarding.  ?Musculoskeletal: no edema or wounds, motor and sensation grossly intact, NT  ? ? ?Labs:  ? ?  Latest Ref Rng & Units 09/03/2021  ?  5:28  AM 09/02/2021  ?  1:22 PM 09/18/2017  ? 11:41 AM  ?CBC  ?WBC 4.0 - 10.5 K/uL 11.3   13.3   14.9    ?Hemoglobin 12.0 - 15.0 g/dL 11.2   13.4   13.9    ?Hematocrit 36.0 - 46.0 % 34.5   41.8   41.2    ?Platelets 150 - 400 K/uL 273   322   515    ? ? ?  Latest Ref Rng & Units 09/03/2021  ?  5:28 AM 09/02/2021  ?  1:22 PM 02/24/2018  ?  3:15 PM  ?CMP  ?Glucose 70 - 99 mg/dL 115   200     ?BUN 6 - 20 mg/dL 12   10     ?Creatinine 0.44 - 1.00 mg/dL 0.62   0.76   0.40    ?Sodium 135 - 145 mmol/L 141   137     ?Potassium 3.5 - 5.1 mmol/L 3.4   3.9     ?Chloride 98 - 111 mmol/L 113   99     ?CO2 22 - 32 mmol/L 29   27     ?Calcium 8.9 - 10.3 mg/dL 8.1   9.8     ?Total Protein 6.5 - 8.1 g/dL 5.9   7.9     ?Total Bilirubin 0.3 - 1.2 mg/dL 0.7   0.9     ?Alkaline Phos 38 - 126 U/L 61   84     ?AST 15 - 41 U/L 14   25     ?ALT 0 - 44 U/L 19   29     ? ? ? ?Imaging studies:  ?KUB pending ? ? ?  Assessment/Plan: (ICD-10's: K56.609) ?56 y.o. female with clinically improving small bowel obstruction most likely secondary to post-surgical adhesive disease ? ? - Will await KUB at 1000; however, she has clinical improvement and ROBF. Once KUB back, we can likely start CLD and ADAT.  ? - No surgical intervention  ? - Monitor abdominal examination; on-going bowel function  ? - Pain control prn; antiemetics prn  ? - Mobilization as tolerated ?- Further management per primary service; we will follow   ? ?All of the above findings and recommendations were discussed with the patient, and the medical team, and all of patient's questions were answered to her expressed satisfaction. ? ?-- ?Edison Simon, PA-C ?[facility] ?09/03/2021, 7:57 AM ?M-F: 7am - 4pm ? ?

## 2021-09-03 NOTE — Assessment & Plan Note (Signed)
Resume SSRI once toelrating po  ?

## 2021-09-03 NOTE — Assessment & Plan Note (Addendum)
history of cholecystectomy/hysterectomy likely adhesions ?surgery rec conservative treatment  ?Diet advanced, pt having BM and flatus, she would like to be d/c home and this was felt to be appropriate w/ close outpatient follow up and adherence to diet instructions ?

## 2021-09-03 NOTE — Plan of Care (Signed)
?  Problem: Clinical Measurements: ?Goal: Ability to maintain clinical measurements within normal limits will improve ?Outcome: Progressing ?Goal: Will remain free from infection ?Outcome: Progressing ?Goal: Diagnostic test results will improve ?Outcome: Progressing ?Goal: Respiratory complications will improve ?Outcome: Progressing ?Goal: Cardiovascular complication will be avoided ?Outcome: Progressing ?  ?Problem: Pain Managment: ?Goal: General experience of comfort will improve ?Outcome: Progressing ?  ?Pt is involved in and agrees with the plan of care. V/S stable. Nausea and vomiting 1x noted last night; felt better after zofran given. No pain on her abdomen at this time. ?

## 2021-09-03 NOTE — Assessment & Plan Note (Signed)
For now prn IV meds ?Resume home regimen when can tolerate po  ?

## 2021-09-03 NOTE — Progress Notes (Signed)
?PROGRESS NOTE ? ? ? ?Catherine Krueger  LNL:[id number] DOB: [birth year]  ?DOA: 09/02/2021 ?Date of Service: Today '@TODAY'$ @ which is Hospital Day 1 ?PCP: [facility] [place] ? ? ? ? ?Brief Narrative:  ?Catherine Krueger is a 56 y.o. female with medical history significant of DMII on insulin pump at home, chronic pancreatitis, depression, HLD, HTN, OSA, peripheral neuropathy who came to ED 09/02/2021 with abdominal pain and n/v x 1 day. She noted no associated diarrhea,or fever but noted chills. Pain in lower abdomen R>L.  Patient found to have  sbo  with transition zone on CT scan of abdomen and pelvis. At that juncture surgery consulted: no need for surgical intervention, recommended admission to hospitalist service. Per notes her pain was improved but still present at time of admission, NO chest pain,or blood in stools or dysuria.  ?09/03/21: BM today, pain improved but still present in LLQ and RLQ, ok to trial clear liquids  ? ?Consultants:  ?General Surgery  ? ?Procedures: ?none ? ? ? ?Subjective: ?Patient reports doing well, feeling hungry, still some pain in lower abdomen but improved. No fever/chills.  ? ? ? ? ? ? ?ASSESSMENT & PLAN: ?  ?Principal Problem: ?  SBO (small bowel obstruction) ([place]) ?Active Problems: ?  Depression ?  HLD (hyperlipidemia) ?  HTN (hypertension) ?  Leukocytosis ?  Diabetes mellitus type 2, uncomplicated ([place]) ?  Chronic pancreatitis ([place]) ? ? ?SBO (small bowel obstruction) ([place]) ?history of cholecystectomy/hysterectomy likely adhesions ?surgery rec conservative treatment  ?supportive care pain medication /antiemetic  ?lactic acid 1.0 ?Ok for clear liquids, advance as tolerated ?General surgery following  ? ?Leukocytosis ?stress response vs infection - imptoving form 13.3 on admission to 11.3 today 09/03/21  ?inflammatory markers CRP, lactic acid, procalcitonin all WNL ?CXR no pneumonia ?UA pending collection ?consider abx if patient spikes temp  ? ?Diabetes mellitus type  2, uncomplicated ([place]) ?place  q4h fs  ?low iss  ?on insulin pump continue basal rate  ? ?Chronic pancreatitis ([place]) ?No active problem ? ?Depression ?Resume SSRI once toelrating po  ? ?HTN (hypertension) ?For now prn IV meds ?Resume home regimen when can tolerate po  ? ?HLD (hyperlipidemia) ?resumehome meds once tolerating po  ? ? ? ?DVT prophylaxis: lovenox ?Code Status: FULL ?Family Communication:  none at this time  ?Disposition Plan: remains inpatient pending clinical improvement/resolution of obstruction ? ? ? ? ? ?Objective: ?Vitals:  ? 09/02/21 1930 09/02/21 2014 09/03/21 0410 09/03/21 0737  ?BP: (!) 166/66 (!) 142/62 125/61 (!) 115/53  ?Pulse: 85 67 72 66  ?Resp: '19 18 18 19  '$ ?Temp:  97.7 ?F (36.5 ?C) 98 ?F (36.7 ?C) 98.2 ?F (36.8 ?C)  ?TempSrc:  Oral  Oral  ?SpO2: 100% 100% 98% 98%  ?Weight:      ?Height:      ? ? ?Intake/Output Summary (Last 24 hours) at 09/03/2021 1458 ?Last data filed at 09/03/2021 1143 ?Gross per 24 hour  ?Intake 1677 ml  ?Output 200 ml  ?Net 1477 ml  ? ?Filed Weights  ? 09/02/21 1321  ?Weight: 55.3 kg  ? ? ?Examination: ? ?General exam: Appears calm and comfortable  ?Respiratory system: Clear to auscultation. Respiratory effort normal. ?Cardiovascular system: S1 & S2 heard, RRR. No JVD, murmurs, rubs, gallops or clicks. No pedal edema. ?Gastrointestinal system: Abdomen is nondistended, soft and nontender. No organomegaly or masses felt. Hyperactive bowel sounds heard. ?Central nervous system: Alert and oriented. No focal neurological deficits ?Skin: No rashes, lesions or ulcers  on limited exam  ?Psychiatry: Judgement and insight appear normal. Mood & affect appropriate.  ? ? ? ? ? ? ?Scheduled Medications:  ? enoxaparin (LOVENOX) injection  40 mg Subcutaneous Q24H  ? insulin aspart  0-6 Units Subcutaneous TID WC  ? sodium chloride flush  3 mL Intravenous Q12H  ? ? ?Continuous Infusions: ? sodium chloride Stopped (09/03/21 0229)  ? acetaminophen    ? dextrose 30 mL/hr at 09/03/21 1143   ? ? ?PRN Medications:  ?acetaminophen, morphine injection, ondansetron **OR** ondansetron (ZOFRAN) IV ? ?Antimicrobials:  ?Anti-infectives (From admission, onward)  ? ? None  ? ?  ? ? ?Data Reviewed: I have personally reviewed following labs and imaging studies ? ?CBC: ?Recent Labs  ?Lab 09/02/21 ?1322 09/03/21 ?7062  ?WBC 13.3* 11.3*  ?HGB 13.4 11.2*  ?HCT 41.8 34.5*  ?MCV 92.7 92.7  ?PLT 322 273  ? ?Basic Metabolic Panel: ?Recent Labs  ?Lab 09/02/21 ?1322 09/03/21 ?3762  ?NA 137 141  ?K 3.9 3.4*  ?CL 99 113*  ?CO2 27 29  ?GLUCOSE 200* 115*  ?BUN 10 12  ?CREATININE 0.76 0.62  ?CALCIUM 9.8 8.1*  ? ?GFR: ?Estimated Creatinine Clearance: 65.7 mL/min (by C-G formula based on SCr of 0.62 mg/dL). ?Liver Function Tests: ?Recent Labs  ?Lab 09/02/21 ?1322 09/03/21 ?8315  ?AST 25 14*  ?ALT 29 19  ?ALKPHOS 84 61  ?BILITOT 0.9 0.7  ?PROT 7.9 5.9*  ?ALBUMIN 4.1 3.0*  ? ?Recent Labs  ?Lab 09/02/21 ?1322  ?LIPASE 21  ? ?No results for input(s): AMMONIA in the last 168 hours. ?Coagulation Profile: ?No results for input(s): INR, PROTIME in the last 168 hours. ?Cardiac Enzymes: ?No results for input(s): CKTOTAL, CKMB, CKMBINDEX, TROPONINI in the last 168 hours. ?BNP (last 3 results) ?No results for input(s): PROBNP in the last 8760 hours. ?HbA1C: ?Recent Labs  ?  09/02/21 ?1754  ?HGBA1C 7.1*  ? ?CBG: ?Recent Labs  ?Lab 09/03/21 ?0212 09/03/21 ?0407 09/03/21 ?1761 09/03/21 ?6073 09/03/21 ?1208  ?GLUCAP 117* 116* 127* 109* 118*  ? ?Lipid Profile: ?No results for input(s): CHOL, HDL, LDLCALC, TRIG, CHOLHDL, LDLDIRECT in the last 72 hours. ?Thyroid Function Tests: ?No results for input(s): TSH, T4TOTAL, FREET4, T3FREE, THYROIDAB in the last 72 hours. ?Anemia Panel: ?No results for input(s): VITAMINB12, FOLATE, FERRITIN, TIBC, IRON, RETICCTPCT in the last 72 hours. ?Urine analysis: ?   ?Component Value Date/Time  ? COLORURINE STRAW (A) 11/30/2016 1837  ? APPEARANCEUR CLEAR (A) 11/30/2016 1837  ? LABSPEC 1.021 11/30/2016 1837  ? PHURINE  5.0 11/30/2016 1837  ? GLUCOSEU >=500 (A) 11/30/2016 1837  ? [place] NEGATIVE 11/30/2016 1837  ? [place] NEGATIVE 11/30/2016 1837  ? KETONESUR 80 (A) 11/30/2016 1837  ? [place] NEGATIVE 11/30/2016 1837  ? NITRITE NEGATIVE 11/30/2016 1837  ? LEUKOCYTESUR NEGATIVE 11/30/2016 1837  ? ?Sepsis Labs: ?'@LABRCNTIP'$ (procalcitonin:4,lacticidven:4) ? ?Recent Results (from the past 240 hour(s))  ?Resp Panel by RT-PCR (Flu A&B, Covid) Nasopharyngeal Swab     Status: None  ? Collection Time: 09/02/21  3:23 PM  ? Specimen: Nasopharyngeal Swab; Nasopharyngeal(NP) swabs in vial transport medium  ?Result Value Ref Range Status  ? SARS Coronavirus 2 by RT PCR NEGATIVE NEGATIVE Final  ?  Comment: (NOTE) ?SARS-CoV-2 target nucleic acids are NOT DETECTED. ? ?The SARS-CoV-2 RNA is generally detectable in upper respiratory ?specimens during the acute phase of infection. The lowest ?concentration of SARS-CoV-2 viral copies this assay can detect is ?138 copies/mL. A negative result does not preclude SARS-Cov-2 ?infection and should not be used  as the sole basis for treatment or ?other patient management decisions. A negative result may occur with  ?improper specimen collection/handling, submission of specimen other ?than nasopharyngeal swab, presence of viral mutation(s) within the ?areas targeted by this assay, and inadequate number of viral ?copies(<138 copies/mL). A negative result must be combined with ?clinical observations, patient history, and epidemiological ?information. The expected result is Negative. ? ?Fact Sheet for Patients:  ?EntrepreneurPulse.com.au ? ?Fact Sheet for Healthcare Providers:  ?IncredibleEmployment.be ? ?This test is no t yet approved or cleared by the [place] FDA and  ?has been authorized for detection and/or diagnosis of SARS-CoV-2 by ?FDA under an Emergency Use Authorization (EUA). This EUA will remain  ?in effect (meaning this test can be used) for the duration of  the ?COVID-19 declaration under Section 564(b)(1) of the Act, 21 ?U.S.C.section 360bbb-3(b)(1), unless the authorization is terminated  ?or revoked sooner.  ? ? ?  ? Influenza A by PCR NEGATIVE NEGATIVE

## 2021-09-03 NOTE — Assessment & Plan Note (Addendum)
stress response vs infection - imptoving form 13.3 on admission to 11.3 09/03/21 --> 8.2 today 09/04/2021 ?inflammatory markers CRP, lactic acid, procalcitonin all WNL ?CXR no pneumonia ?UA not collected until today 09/04/21, (+)nitrite, minimal WBC, will send home on po abx to follow w/ PCP  ?

## 2021-09-03 NOTE — Assessment & Plan Note (Signed)
resumehome meds once tolerating po  ?

## 2021-09-03 NOTE — Progress Notes (Signed)
Patient updated regarding clear liquid diet. Provided water and menu, verbalizes understanding of how to order meals and current dietary restrictions.  ?

## 2021-09-04 DIAGNOSIS — R8290 Unspecified abnormal findings in urine: Secondary | ICD-10-CM

## 2021-09-04 LAB — BASIC METABOLIC PANEL
Anion gap: 3 — ABNORMAL LOW (ref 5–15)
BUN: 8 mg/dL (ref 6–20)
CO2: 28 mmol/L (ref 22–32)
Calcium: 8.7 mg/dL — ABNORMAL LOW (ref 8.9–10.3)
Chloride: 110 mmol/L (ref 98–111)
Creatinine, Ser: 0.58 mg/dL (ref 0.44–1.00)
GFR, Estimated: >60 mL/min (ref >60)
Glucose, Bld: 125 mg/dL — ABNORMAL HIGH (ref 70–99)
Potassium: 4 mmol/L (ref 3.5–5.1)
Sodium: 141 mmol/L (ref 135–145)

## 2021-09-04 LAB — CBC
HCT: 34.1 % — ABNORMAL LOW (ref 36.0–46.0)
Hemoglobin: 11.1 g/dL — ABNORMAL LOW (ref 12.0–15.0)
MCH: 30.6 pg (ref 26.0–34.0)
MCHC: 32.6 g/dL (ref 30.0–36.0)
MCV: 93.9 fL (ref 80.0–100.0)
Platelets: 207 10*3/uL (ref 150–400)
RBC: 3.63 MIL/uL — ABNORMAL LOW (ref 3.87–5.11)
RDW: 12.6 % (ref 11.5–15.5)
WBC: 8.2 10*3/uL (ref 4.0–10.5)
nRBC: 0 % (ref 0.0–0.2)

## 2021-09-04 LAB — URINALYSIS, COMPLETE (UACMP) WITH MICROSCOPIC
Bilirubin Urine: NEGATIVE
Glucose, UA: 150 mg/dL — AB
Ketones, ur: NEGATIVE mg/dL
Nitrite: POSITIVE — AB
Protein, ur: NEGATIVE mg/dL
Specific Gravity, Urine: 1.004 — ABNORMAL LOW (ref 1.005–1.030)
pH: 5 (ref 5.0–8.0)

## 2021-09-04 LAB — GLUCOSE, CAPILLARY
Glucose-Capillary: 121 mg/dL — ABNORMAL HIGH (ref 70–99)
Glucose-Capillary: 182 mg/dL — ABNORMAL HIGH (ref 70–99)

## 2021-09-04 MED ORDER — NITROFURANTOIN MONOHYD MACRO 100 MG PO CAPS: 100.0000 mg | ORAL_CAPSULE | Freq: Two times a day (BID) | ORAL | 0 refills | Status: AC

## 2021-09-04 MED ORDER — GLUCERNA SHAKE PO LIQD
237.0000 mL | Freq: Three times a day (TID) | ORAL | Status: DC
  Administered 2021-09-04: 237 mL via ORAL

## 2021-09-04 NOTE — Care Management (Signed)
Pre-rounds chart review and preliminary plan / goals for today: ?Significant overnight events per chart: none ?Significant new findings on labs/other diagnostics:  ?BP on the low side ?WBC trending down  ?Dispo plan: home when medically improved ?Pending/Plan:  ?Needs to tolerate diet and have consistent BM prior to clearance for discharge ?Surgery following, appreciate recs re: d/c appropriate vs continue to monitor  ? ?Please feel free to reach out via secure chat in Epic for non-urgent issues. Please page for urgent matters! ? ?This note will be updated after rounds to full progress note / discharge note as appropriate  ? ?

## 2021-09-04 NOTE — Care Management Important Message (Signed)
Important Message ? ?Patient Details  ?Name: Catherine Krueger ?MRN: [id number] ?Date of Birth: 1965/09/16 ? ? ?Medicare Important Message Given:  N/A - LOS <3 / Initial given by admissions ? ? ? ? ?Dannette Barbara ?09/04/2021, 9:33 AM ?

## 2021-09-04 NOTE — Progress Notes (Signed)
Patient wants to use own insulin pump. Dr Sheppard Coil notified and gave orders for patient to use her own insulin pump. Order received to discontinue IVF/D10 ?

## 2021-09-04 NOTE — Progress Notes (Signed)
Discharge instructions reviewed with the patient. Patient educated on diet to be on at discharge. Patient sent out via wheelchair with belongings ?

## 2021-09-04 NOTE — Progress Notes (Signed)
[facility] ?SURGICAL PROGRESS NOTE (cpt [phone]) ? ?Hospital Day(s): 2.  ? ?Interval History: Patient seen and examined, no acute events or new complaints overnight. Patient reports continued improvement. Some mild waxing and waning abdominal discomfort but she endorsed numerous times this morning that "this was much better than admission." Leukocytosis now resolved; 8.2K. Renal function normal; sCr - 0.58; UO - unmeasured ccs + unmeasured. No significant electrolyte derangements. No new imaging studies. She continues to pass flatus and have bowel movements. She is on CLD; tolerating well . ? ?Review of Systems:  ?Constitutional: denies fever, chills  ?HEENT: denies cough or congestion  ?Respiratory: denies any shortness of breath  ?Cardiovascular: denies chest pain or palpitations  ?Gastrointestinal: + abdominal pain (improved; near resolution); denied N/V ?Genitourinary: denies burning with urination or urinary frequency ?Musculoskeletal: denies pain, decreased motor or sensation ? ?Vital signs in last 24 hours: [min-max] current  ?Temp:  [97.5 ?F (36.4 ?C)] 97.5 ?F (36.4 ?C) (05/11 1550) ?Pulse Rate:  [65] 65 (05/11 1550) ?Resp:  [19] 19 (05/11 1550) ?BP: (111)/(57) 111/57 (05/11 1550) ?SpO2:  [100 %] 100 % (05/11 1550) ?Weight:  [55.2 kg] 55.2 kg (05/12 0452)     Height: '5\' 3"'$  (160 cm) Weight: 55.2 kg BMI (Calculated): 21.58  ? ?Intake/Output last 2 shifts:  ?05/11 0701 - 05/12 0700 ?In: 255.3 [I.V.:255.3] ?Out: -   ? ?Physical Exam:  ?Constitutional: alert, cooperative and no distress  ?HENT: normocephalic without obvious abnormality  ?Eyes: PERRL, EOM's grossly intact and symmetric  ?Respiratory: breathing non-labored at rest  ?Cardiovascular: regular rate and sinus rhythm  ?Gastrointestinal: soft, non-tender, and non-distended, no rebound/guarding.  ?Musculoskeletal: no edema or wounds, motor and sensation grossly intact, NT  ? ? ?Labs:  ? ?  Latest Ref Rng & Units 09/04/2021  ?  6:16 AM  09/03/2021  ?  5:28 AM 09/02/2021  ?  1:22 PM  ?CBC  ?WBC 4.0 - 10.5 K/uL 8.2   11.3   13.3    ?Hemoglobin 12.0 - 15.0 g/dL 11.1   11.2   13.4    ?Hematocrit 36.0 - 46.0 % 34.1   34.5   41.8    ?Platelets 150 - 400 K/uL 207   273   322    ? ? ?  Latest Ref Rng & Units 09/04/2021  ?  6:16 AM 09/03/2021  ?  5:28 AM 09/02/2021  ?  1:22 PM  ?CMP  ?Glucose 70 - 99 mg/dL 125   115   200    ?BUN 6 - 20 mg/dL '8   12   10    '$ ?Creatinine 0.44 - 1.00 mg/dL 0.58   0.62   0.76    ?Sodium 135 - 145 mmol/L 141   141   137    ?Potassium 3.5 - 5.1 mmol/L 4.0   3.4   3.9    ?Chloride 98 - 111 mmol/L 110   113   99    ?CO2 22 - 32 mmol/L '28   29   27    '$ ?Calcium 8.9 - 10.3 mg/dL 8.7   8.1   9.8    ?Total Protein 6.5 - 8.1 g/dL  5.9   7.9    ?Total Bilirubin 0.3 - 1.2 mg/dL  0.7   0.9    ?Alkaline Phos 38 - 126 U/L  61   84    ?AST 15 - 41 U/L  14   25    ?ALT 0 - 44 U/L  19  29    ? ? ? ?Imaging studies:  ?No new pertinent imaging studies  ? ? ?Assessment/Plan: (ICD-10's: K56.609) ?56 y.o. female with clinically improving small bowel obstruction most likely secondary to post-surgical adhesive disease ? ? - Full liquids this morning; advance as tolerated today ? - No surgical intervention  ? - Monitor abdominal examination; on-going bowel function  ? - Pain control prn; antiemetics prn  ? - Mobilization as tolerated ?- Further management per primary service ? ?- Discharge Planning; From a surgical perspective, if she is able to tolerate advancement of diet today, she may be able to DC home in AM. Nothing further from surgical standpoint. I will sign off but we will be available. She does NOT need outpatient follow up.   ? ?All of the above findings and recommendations were discussed with the patient, and the medical team, and all of patient's questions were answered to her expressed satisfaction. ? ?-- ?Edison Simon, PA-C ?[facility] ?09/04/2021, 7:43 AM ?M-F: 7am - 4pm ? ?

## 2021-09-04 NOTE — Progress Notes (Signed)
Initial Nutrition Assessment ? ?DOCUMENTATION CODES:  ? ?Not applicable ? ?INTERVENTION:  ? ?Glucerna Shake po TID, each supplement provides 220 kcal and 10 grams of protein ? ?Recommend MVI po daily  ? ?Pt at moderate refeed risk; recommend monitor potassium, magnesium and phosphorus labs daily until stable ? ?NUTRITION DIAGNOSIS:  ? ?Inadequate oral intake related to acute illness as evidenced by other (comment) (pt on a liquid diet). ? ?GOAL:  ? ?Patient will meet greater than or equal to 90% of their needs ? ?MONITOR:  ? ?PO intake, Supplement acceptance, Labs, Weight trends, Skin, I & O's ? ?REASON FOR ASSESSMENT:  ? ?Malnutrition Screening Tool ?  ? ?ASSESSMENT:  ? ?56 y/o female with h/o Type I DM, depression, HTN, HLD, GERD, OSA, cervical cancer s/p chemo/XRT, chronic pancreatitis and H. Pylori who is admitted with SBO. ? ?Met with pt in room today. Pt reports poor appetite and oral intake for one day pta r/t nausea and vomiting. Pt reports her appetite is improved in hospital; pt eating 100% of her full liquid diet in hospital and is drinking Glucerna supplements. Pt reports that she feels great today and is ready to go home. Pt with a h/o chronic pancreatitis and enzyme deficiency; pt takes creon at home. Pt reports that the creon does help with her diarrhea and was recently increased. Pt reports that she does not take any vitamins or drink supplements at home. RD discussed with pt the importance of adequate nutrition needed to preserve muscle. RD also discussed with pt her increased risk for vitamin malabsorption r/t her chronic pancreatitis and recommended daily chewable MVI, vitamin D supplementation and protein supplements at home. Per chart pt appears weight stable at baseline. Pt to discharge home today.  ? ?Medications reviewed and include: lovenox ? ?Labs reviewed: K 4.0 wnl ?Hgb 11.1(L), Hct 34.1(L) ?Cbgs- 182, 121 x 24 hrs ?AIC 7.1(H)- 5/10 ? ?NUTRITION - FOCUSED PHYSICAL EXAM: ? ?Flowsheet Row  Most Recent Value  ?Orbital Region No depletion  ?Upper Arm Region No depletion  ?Thoracic and Lumbar Region No depletion  ?Buccal Region No depletion  ?Temple Region No depletion  ?Clavicle Bone Region Mild depletion  ?Clavicle and Acromion Bone Region Mild depletion  ?Scapular Bone Region No depletion  ?Dorsal Hand Mild depletion  ?Patellar Region Mild depletion  ?Anterior Thigh Region Mild depletion  ?Posterior Calf Region Mild depletion  ?Edema (RD Assessment) None  ?Hair Reviewed  ?Eyes Reviewed  ?Mouth Reviewed  ?Skin Reviewed  ?Nails Reviewed  ? ?Diet Order:   ?Diet Order   ? ? None  ? ?  ? ?EDUCATION NEEDS:  ? ?Education needs have been addressed ? ?Skin:  Skin Assessment: Reviewed RN Assessment ? ?Last BM:  5/12- TYPE 7 ? ?Height:  ? ?Ht Readings from Last 1 Encounters:  ?09/02/21 5' 3"  (1.6 m)  ? ? ?Weight:  ? ?Wt Readings from Last 1 Encounters:  ?09/04/21 55.2 kg  ? ? ?Ideal Body Weight:  52.2 kg ? ?BMI:  Body mass index is 21.58 kg/m?. ? ?Estimated Nutritional Needs:  ? ?Kcal:  1500-1700kcal/day ? ?Protein:  75-85g/day ? ?Fluid:  1.6-1.8L/day ? ?Koleen Distance MS, RD, LDN ?Please refer to AMION for RD and/or RD on-call/weekend/after hours pager ? ?

## 2021-09-04 NOTE — Discharge Summary (Signed)
?Physician Discharge Summary ?  ?Patient: Catherine Krueger MRN: [id number] DOB: 07-Jul-1965  ?Admit date:     09/02/2021  ?Discharge date: 09/04/2021  ?Discharge Physician: Emeterio Reeve  ? ?PCP: [facility] [place]  ? ?Recommendations at discharge:  ?Schedule follow-up with PCP in 1 to 2 weeks ?Printed instructions for slowly advancing diet as tolerated from full liquid diet to soft diet to bland diet.  Return precautions noted ?PCP to follow up urine culture - pt started on nitrofurantoin as empiric treatment  ? ?Discharge Diagnoses: ?Principal Problem: ?  SBO (small bowel obstruction) ([place]) ?Active Problems: ?  Depression ?  HLD (hyperlipidemia) ?  HTN (hypertension) ?  Leukocytosis ?  Diabetes mellitus type 2, uncomplicated ([place]) ?  Chronic pancreatitis ([place]) ?  Abnormal urinalysis ? ?Resolved Problems: ?  * No resolved hospital problems. * ? ?Hospital Course: ?Catherine Krueger is a 56 y.o. female with medical history significant of DMII on insulin pump at home, chronic pancreatitis, depression, HLD, HTN, OSA, peripheral neuropathy who came to ED 09/02/2021 with abdominal pain and n/v x 1 day. She noted no associated diarrhea,or fever but noted chills. Pain in lower abdomen R>L.  Patient found to have  sbo  with transition zone on CT scan of abdomen and pelvis. At that juncture surgery consulted: no need for surgical intervention, recommended admission to hospitalist service. Per notes her pain was improved but still present at time of admission, NO chest pain,or blood in stools or dysuria.  ?09/03/21: BM today, pain improved but still present in LLQ and RLQ, ok to trial clear liquids  ?09/04/21: Advance to full liquid diet without issue, patient wanted to try some saltine crackers and this was permitted, tolerated this as well.  Had BM this morning.  Patient feels okay to discharge with close outpatient follow-up, instructions regarding slow advancement of diet.  In shared decision-making  with patient, felt stable/good for discharge this afternoon ? ?Assessment and Plan: ?* SBO (small bowel obstruction) ([place]) ?history of cholecystectomy/hysterectomy likely adhesions ?surgery rec conservative treatment  ?Diet advanced, pt having BM and flatus, she would like to be d/c home and this was felt to be appropriate w/ close outpatient follow up and adherence to diet instructions ? ?Abnormal urinalysis ?(+)nitrate and WBC, no symptoms, serum WBC trendign down, no concern for severe infection, will send home on po abx empiric treatment and pt to follow w/ PCP / seek emergency medical care if needed  ? ?Chronic pancreatitis ([place]) ?No active problem ? ?Diabetes mellitus type 2, uncomplicated ([place]) ?on insulin pump  ?Follow outpatient ? ?Leukocytosis ?stress response vs infection - imptoving form 13.3 on admission to 11.3 09/03/21 --> 8.2 today 09/04/2021 ?inflammatory markers CRP, lactic acid, procalcitonin all WNL ?CXR no pneumonia ?UA not collected until today 09/04/21, (+)nitrite, minimal WBC, will send home on po abx to follow w/ PCP  ? ?HTN (hypertension) ?For now prn IV meds ?Resume home regimen when can tolerate po  ? ?HLD (hyperlipidemia) ?resumehome meds once tolerating po  ? ?Depression ?Resume SSRI once toelrating po  ? ? ? ? ?  ? ? ?Consultants: [place]  ?Procedures performed: none  ?Disposition: Home ?Diet recommendation:  ?Discharge Diet Orders (From admission, onward)  ? ?  Start     Ordered  ? 09/04/21 0000  Diet - low sodium heart healthy       ?See attached instructions for liquid --> bland diet  09/04/21 1706  ? ?  ?  ? ?  ? ?  Clear liquid diet, advance as tolerated ?DISCHARGE MEDICATION: ?Allergies as of 09/04/2021   ? ?   Reactions  ? Diclofenac Sodium Other (See Comments)  ? Keflex [cephalexin] Palpitations  ? ?  ? ?  ?Medication List  ?  ? ?STOP taking these medications   ? ?CHOLECALCIFEROL PO ?  ?clotrimazole 1 % cream ?Commonly known as: LOTRIMIN ?  ?DULoxetine 60 MG  capsule ?Commonly known as: CYMBALTA ?  ?ibuprofen 400 MG tablet ?Commonly known as: ADVIL ?  ?insulin glargine 100 UNIT/ML Solostar Pen ?Commonly known as: Lantus SoloStar ?  ?meloxicam 7.5 MG tablet ?Commonly known as: MOBIC ?  ?MULTIVITAMIN GUMMIES ADULT PO ?  ?omeprazole 40 MG capsule ?Commonly known as: PRILOSEC ?  ?pregabalin 50 MG capsule ?Commonly known as: LYRICA ?  ?promethazine 25 MG tablet ?Commonly known as: PHENERGAN ?  ?sucralfate 1 g tablet ?Commonly known as: CARAFATE ?  ?tiZANidine 4 MG tablet ?Commonly known as: ZANAFLEX ?  ? ?  ? ?TAKE these medications   ? ?atorvastatin 40 MG tablet ?Commonly known as: LIPITOR ?Take 40 mg by mouth daily. ?  ?famotidine 20 MG tablet ?Commonly known as: PEPCID ?Take 1 tablet (20 mg total) by mouth 2 (two) times daily. ?  ?freestyle lancets ?Use as instructed ?  ?glucose blood test strip ?Use as instructed ?  ?HumaLOG 100 UNIT/ML injection ?Generic drug: insulin lispro ?Inject into the skin. Uses with Insulin pump ?What changed: Another medication with the same name was removed. Continue taking this medication, and follow the directions you see here. ?  ?insulin aspart 100 UNIT/ML injection ?Commonly known as: NovoLOG ?4 units TID with each meals ?  ?Insulin Pen Needle 31G X 5 MM Misc ?30 application by Does not apply route 3 (three) times daily after meals. ?  ?lipase/protease/amylase 36000 UNITS Cpep capsule ?Commonly known as: CREON ?Take 35,009-38,182 Units by mouth 3 (three) times daily before meals. ?  ?lisinopril 40 MG tablet ?Commonly known as: ZESTRIL ?Take 40 mg by mouth daily. ?  ?metFORMIN 1000 MG tablet ?Commonly known as: Glucophage ?Take 1 tablet (1,000 mg total) by mouth 2 (two) times daily with a meal. ?  ?nitrofurantoin (macrocrystal-monohydrate) 100 MG capsule ?Commonly known as: MACROBID ?Take 1 capsule (100 mg total) by mouth 2 (two) times daily. ?  ?ondansetron 4 MG tablet ?Commonly known as: ZOFRAN ?  ?oxyCODONE-acetaminophen 5-325 MG  tablet ?Commonly known as: PERCOCET/ROXICET ?Take 1-2 tablets by mouth every 6 (six) hours as needed for severe pain. ?  ?pantoprazole 40 MG tablet ?Commonly known as: PROTONIX ?Take 40 mg by mouth daily. ?  ?sertraline 50 MG tablet ?Commonly known as: ZOLOFT ?Take 50 mg by mouth daily. ?  ? ?  ? ? ?Discharge Exam: ?Filed Weights  ? 09/02/21 1321 09/04/21 0452  ?Weight: 55.3 kg 55.2 kg  ? ?Constitutional:  ?VSS ?General Appearance: alert, well-developed, well-nourished, NAD ?Ears, Nose, Mouth, Throat: ?Normal appearance ?Neck: ?No masses, trachea midline ?No thyroid enlargement/tenderness/mass appreciated ?Respiratory: ?Normal respiratory effort ?Breath sounds normal, no wheeze/rhonchi/rales ?Cardiovascular: ?S1/S2 normal, no murmur/rub/gallop auscultated ?No lower extremity edema ?Gastrointestinal: ?Nontender, no masses ?No hepatomegaly, no splenomegaly ?No hernia appreciated ?Musculoskeletal:  ?No clubbing/cyanosis of digits ?Neurological: ?No cranial nerve deficit on limited exam ?Motor intact and symmetric ?Psychiatric: ?Normal judgment/insight ?Normal mood and affect ? ? ?Condition at discharge: good ? ?The results of significant diagnostics from this hospitalization (including imaging, microbiology, ancillary and laboratory) are listed below for reference.  ? ?Imaging Studies: ?Abd 1 View (KUB) ? ?Result Date:  09/03/2021 ?CLINICAL DATA:  Small bowel obstruction. EXAM: ABDOMEN - 1 VIEW COMPARISON:  Sep 02, 2021. FINDINGS: Status post cholecystectomy. No colonic dilatation is noted. Mildly dilated small bowel loops are noted concerning for distal small bowel obstruction is noted on prior CT scan. Surgical clips are noted in the pelvis. Residual contrast is noted in the urinary bladder. IMPRESSION: Mildly dilated small bowel loops are noted concerning for distal small bowel obstruction as noted on prior CT scan. Electronically Signed   By: Marijo Conception M.D.   On: 09/03/2021 10:18  ? ?CT ABDOMEN PELVIS W  CONTRAST ? ?Result Date: 09/02/2021 ?CLINICAL DATA:  Abdominal pain, nausea and vomiting which began yesterday. Type 1 diabetes. EXAM: CT ABDOMEN AND PELVIS WITH CONTRAST TECHNIQUE: Multidetector CT imaging of the abdomen and pelvis

## 2021-09-04 NOTE — TOC Initial Note (Signed)
Transition of Care (TOC) - Initial/Assessment Note  ? ? ?Patient Details  ?Name: FRIMY UFFELMAN ?MRN: [id number] ?Date of Birth: [birth year] ? ?Transition of Care (TOC) CM/SW Contact:    ?Beverly Sessions, RN ?Phone Number: ?09/04/2021, 12:13 PM ? ?Clinical Narrative:                 ? ? ?Transition of Care (TOC) Screening Note ? ? ?Patient Details  ?Name: MISSI MCMACKIN ?Date of Birth: 1966-01-08 ? ? ?Transition of Care (TOC) CM/SW Contact:    ?Beverly Sessions, RN ?Phone Number: ?09/04/2021, 12:13 PM ? ? ? ?Transition of Care Department [facility]) has reviewed patient and no TOC needs have been identified at this time. We will continue to monitor patient advancement through interdisciplinary progression rounds. If new patient transition needs arise, please place a TOC consult. ? ? ?  ?  ? ? ?Patient Goals and CMS Choice ?  ?  ?  ? ?Expected Discharge Plan and Services ?  ?  ?  ?  ?  ?                ?  ?  ?  ?  ?  ?  ?  ?  ?  ?  ? ?Prior Living Arrangements/Services ?  ?  ?  ?       ?  ?  ?  ?  ? ?Activities of Daily Living ?Home Assistive Devices/Equipment: Eyeglasses, Dentures (specify type) (dentures at home) ?ADL Screening (condition at time of admission) ?Patient's cognitive ability adequate to safely complete daily activities?: Yes ?Is the patient deaf or have difficulty hearing?: No ?Does the patient have difficulty seeing, even when wearing glasses/contacts?: No ?Does the patient have difficulty concentrating, remembering, or making decisions?: No ?Patient able to express need for assistance with ADLs?: Yes ?Does the patient have difficulty dressing or bathing?: No ?Independently performs ADLs?: Yes (appropriate for developmental age) ?Does the patient have difficulty walking or climbing stairs?: No ?Weakness of Legs: Both ?Weakness of Arms/Hands: None ? ?Permission Sought/Granted ?  ?  ?   ?   ?   ?   ? ?Emotional Assessment ?  ?  ?  ?  ?  ?  ? ?Admission diagnosis:  SBO (small bowel obstruction) ([place])  [K56.609] ?SOB (shortness of breath) [R06.02] ?Patient Active Problem List  ? Diagnosis Date Noted  ? Chronic pancreatitis ([place]) 09/03/2021  ? SBO (small bowel obstruction) ([place]) 09/02/2021  ? Diabetes mellitus type 2, uncomplicated ([place]) 62/37/6283  ? Peripheral neuropathy due to chemotherapy ([place]) 01/05/2017  ? Hypokalemia 02/24/2015  ? Hyponatremia 02/24/2015  ? Leukocytosis 02/24/2015  ? Anemia 02/24/2015  ? Obesity 02/24/2015  ? Dehydration 02/24/2015  ? Tobacco abuse 02/24/2015  ? DKA (diabetic ketoacidoses) 02/22/2015  ? Depression 02/22/2015  ? HLD (hyperlipidemia) 02/22/2015  ? HTN (hypertension) 02/22/2015  ? GERD (gastroesophageal reflux disease) 02/22/2015  ? UTI (lower urinary tract infection) 02/22/2015  ? AKI (acute kidney injury) ([place]) 02/22/2015  ? ?PCP:  [facility] [place] ?Pharmacy:   ?[facility] ([facility] [place] [facility] [place] ?[address] ([place]) [place] [address] ?Phone: [phone] Fax: [phone] ? ? ? ? ?Social Determinants of Health (SDOH) Interventions ?  ? ?Readmission Risk Interventions ?   ? View : No data to display.  ?  ?  ?  ? ? ? ?

## 2021-09-04 NOTE — Assessment & Plan Note (Signed)
(+)  nitrate and WBC, no symptoms, serum WBC trendign down, no concern for severe infection, will send home on po abx empiric treatment and pt to follow w/ PCP / seek emergency medical care if needed  ?

## 2021-12-30 ENCOUNTER — Other Ambulatory Visit: Payer: Self-pay | Admitting: Primary Care

## 2021-12-30 DIAGNOSIS — Z1231 Encounter for screening mammogram for malignant neoplasm of breast: Secondary | ICD-10-CM

## 2022-04-14 ENCOUNTER — Other Ambulatory Visit: Payer: Self-pay | Admitting: Family Medicine

## 2022-04-14 DIAGNOSIS — Z1231 Encounter for screening mammogram for malignant neoplasm of breast: Secondary | ICD-10-CM

## 2022-04-20 ENCOUNTER — Ambulatory Visit
Admission: RE | Admit: 2022-04-20 | Discharge: 2022-04-20 | Disposition: A | Discharge status: Home / Self Care | Payer: Medicare Other | Source: Ambulatory Visit | Attending: Primary Care | Admitting: Primary Care

## 2022-04-20 DIAGNOSIS — Z1231 Encounter for screening mammogram for malignant neoplasm of breast: Secondary | ICD-10-CM | POA: Diagnosis present

## 2022-05-17 ENCOUNTER — Ambulatory Visit: Admission: RE | Admit: 2022-05-17 | Payer: 59 | Source: Ambulatory Visit | Admitting: Gastroenterology

## 2022-05-17 ENCOUNTER — Encounter: Admission: RE | Payer: Self-pay | Source: Ambulatory Visit

## 2022-05-17 SURGERY — COLONOSCOPY WITH PROPOFOL
Anesthesia: General

## 2023-03-01 ENCOUNTER — Encounter: Payer: Self-pay | Admitting: Gastroenterology

## 2023-03-16 ENCOUNTER — Encounter: Payer: Self-pay | Admitting: Certified Registered Nurse Anesthetist

## 2023-03-17 ENCOUNTER — Encounter
Admission: RE | Disposition: A | Discharge status: Home / Self Care | Payer: Self-pay | Source: Home / Self Care | Attending: Gastroenterology

## 2023-03-17 ENCOUNTER — Ambulatory Visit
Admission: RE | Admit: 2023-03-17 | Discharge: 2023-03-17 | Disposition: A | Discharge status: Home / Self Care | Payer: 59 | Attending: Gastroenterology | Admitting: Gastroenterology

## 2023-03-17 DIAGNOSIS — R1319 Other dysphagia: Secondary | ICD-10-CM | POA: Insufficient documentation

## 2023-03-17 DIAGNOSIS — Z1211 Encounter for screening for malignant neoplasm of colon: Secondary | ICD-10-CM | POA: Insufficient documentation

## 2023-03-17 DIAGNOSIS — K224 Dyskinesia of esophagus: Secondary | ICD-10-CM | POA: Insufficient documentation

## 2023-03-17 DIAGNOSIS — Z538 Procedure and treatment not carried out for other reasons: Secondary | ICD-10-CM | POA: Diagnosis not present

## 2023-03-17 HISTORY — PX: COLONOSCOPY WITH PROPOFOL: SHX5780

## 2023-03-17 HISTORY — DX: Radiculopathy, lumbar region: M54.16

## 2023-03-17 HISTORY — DX: Other complications of anesthesia, initial encounter: T88.59XA

## 2023-03-17 HISTORY — PX: ESOPHAGOGASTRODUODENOSCOPY (EGD) WITH PROPOFOL: SHX5813

## 2023-03-17 HISTORY — DX: Foot drop, left foot: M21.372

## 2023-03-17 SURGERY — COLONOSCOPY WITH PROPOFOL
Anesthesia: General

## 2023-03-17 MED ORDER — SODIUM CHLORIDE 0.9 % IV SOLN
INTRAVENOUS | Status: DC
Start: 2023-03-17 — End: 2023-03-17

## 2023-03-17 MED ORDER — PROPOFOL 1000 MG/100ML IV EMUL
INTRAVENOUS | Status: AC
  Filled 2023-03-17: qty 100

## 2023-03-17 NOTE — H&P (Signed)
Patient did not complete 1/2 of colonoscopy prep overnight. Will reschedule for a later date.  Enis Slipper, DO [facility] Gastroenterology

## 2023-03-17 NOTE — Progress Notes (Signed)
Patient did not do the whole prep and was drinking coke before coming back for her procedure

## 2023-03-18 ENCOUNTER — Encounter: Payer: Self-pay | Admitting: Gastroenterology

## 2023-06-14 ENCOUNTER — Encounter: Payer: Self-pay | Admitting: Gastroenterology

## 2023-06-24 ENCOUNTER — Encounter: Payer: Self-pay | Admitting: Gastroenterology

## 2023-06-26 NOTE — H&P (Signed)
 Pre-Procedure H&P   Patient ID: Catherine Krueger is a 58 y.o. female.  Gastroenterology Provider: Jaynie Collins, DO  Referring Provider: Jacob Moores, PA PCP: [facility]  Date: 06/27/2023  HPI Catherine Krueger is a 58 y.o. female who presents today for Esophagogastroduodenoscopy and Colonoscopy for dysphagia, gerd, colorectal cancer screening .  Pt notes suprasternal sticking to solids. She last underwent EGD in 07/2021 wher ehse wa sfound to have irregular z line and abnormal esophageal motility, gastritis and duodenitis. Bx negative for barretts, eoe, h pylori. She was empirically dilated with 52 fr maloney w/o change on relook, however, patient felt sx improved.  Attempted colonoscopy in 2023, however, poor prep and not attempted. Last completed colonoscopy in 08/2013 without polyps.   Cr 0.7  S/p chole, hysto  Patient only drank half of second bottle of suprep   Past Medical History:  Diagnosis Date   Arthritis    Cervical cancer (HCC)    remission, dx at age 52   Chronic pancreatitis (HCC)    Closed bimalleolar fracture of right ankle    Complication of anesthesia    Depression    Diabetes mellitus, type II (HCC)    GERD (gastroesophageal reflux disease)    H. pylori infection 2013   HLD (hyperlipidemia)    Hypertension    Insulin pump in place    Left foot drop    Lumbar radiculopathy    Osteoarthritis of knee    Peripheral neuropathy due to chemotherapy [facility])    Personal history of chemotherapy    Personal history of radiation therapy    Sleep apnea     Past Surgical History:  Procedure Laterality Date   ABDOMINAL HYSTERECTOMY     CHOLECYSTECTOMY     COLONOSCOPY     COLONOSCOPY WITH PROPOFOL N/A 03/17/2023   Procedure: COLONOSCOPY WITH PROPOFOL;  Surgeon: Jaynie Collins, DO;  Location: [facility] ENDOSCOPY;  Service: Gastroenterology;  Laterality: N/A;   ESOPHAGOGASTRODUODENOSCOPY (EGD) WITH PROPOFOL N/A 07/30/2021    Procedure: ESOPHAGOGASTRODUODENOSCOPY (EGD) WITH PROPOFOL;  Surgeon: Jaynie Collins, DO;  Location[facility] ENDOSCOPY;  Service: Gastroenterology;  Laterality: N/A;   ESOPHAGOGASTRODUODENOSCOPY (EGD) WITH PROPOFOL N/A 03/17/2023   Procedure: ESOPHAGOGASTRODUODENOSCOPY (EGD) WITH PROPOFOL;  Surgeon: Jaynie Collins, DO;  Location: [facility] ENDOSCOPY;  Service: Gastroenterology;  Laterality: N/A;   EUS N/A 06/19/2020   Procedure: FULL UPPER ENDOSCOPIC ULTRASOUND (EUS) RADIAL;  Surgeon: Burbridge, Prudencio Pair, MD;  Location: [facility] ENDOSCOPY;  Service: Endoscopy;  Laterality: N/A;   OOPHORECTOMY     UPPER GI ENDOSCOPY      Family History No h/o GI disease or malignancy   Review of Systems  Constitutional:  Negative for activity change, appetite change, chills, diaphoresis, fatigue, fever and unexpected weight change.  HENT:  Negative for trouble swallowing and voice change.   Respiratory:  Negative for shortness of breath and wheezing.   Cardiovascular:  Negative for chest pain, palpitations and leg swelling.  Gastrointestinal:  Negative for abdominal distention, abdominal pain, anal bleeding, blood in stool, constipation, diarrhea, nausea, rectal pain and vomiting.  Musculoskeletal:  Negative for arthralgias and myalgias.  Skin:  Negative for color change and pallor.  Neurological:  Negative for dizziness, syncope and weakness.  Psychiatric/Behavioral:  Negative for confusion.   All other systems reviewed and are negative.    Medications No current facility-administered medications on file prior to encounter.   Current Outpatient Medications on File Prior to Encounter  Medication Sig Dispense Refill  HUMALOG 100 UNIT/ML injection Inject into the skin. Uses with Insulin pump     lipase/protease/amylase (CREON) 36000 UNITS CPEP capsule Take 36,000-72,000 Units by mouth 3 (three) times daily before meals.     lisinopril (ZESTRIL) 40 MG tablet Take 40 mg by mouth daily.     pantoprazole  (PROTONIX) 40 MG tablet Take 40 mg by mouth daily.     sertraline (ZOLOFT) 50 MG tablet Take 50 mg by mouth daily.     atorvastatin (LIPITOR) 40 MG tablet Take 40 mg by mouth daily.     famotidine (PEPCID) 20 MG tablet Take 1 tablet (20 mg total) by mouth 2 (two) times daily. 60 tablet 0   glucose blood test strip Use as instructed 100 each 1   insulin aspart (NOVOLOG) 100 UNIT/ML injection 4 units TID with each meals 10 mL 3   Insulin Pen Needle 31G X 5 MM MISC 30 application by Does not apply route 3 (three) times daily after meals. 90 each 0   Lancets (FREESTYLE) lancets Use as instructed 100 each 1   metFORMIN (GLUCOPHAGE) 1000 MG tablet Take 1 tablet (1,000 mg total) by mouth 2 (two) times daily with a meal. 60 tablet 1   nitrofurantoin, macrocrystal-monohydrate, (MACROBID) 100 MG capsule Take 1 capsule (100 mg total) by mouth 2 (two) times daily. (Patient not taking: Reported on 06/27/2023) 10 capsule 0   ondansetron (ZOFRAN) 4 MG tablet  (Patient not taking: Reported on 09/02/2021)     oxyCODONE-acetaminophen (PERCOCET/ROXICET) 5-325 MG tablet Take 1-2 tablets by mouth every 6 (six) hours as needed for severe pain. (Patient not taking: Reported on 09/02/2021) 30 tablet 0    Pertinent medications related to GI and procedure were reviewed by me with the patient prior to the procedure   Current Facility-Administered Medications:    0.9 %  sodium chloride infusion, , Intravenous, Continuous, Jaynie Collins, DO  sodium chloride         Allergies  Allergen Reactions   Diclofenac Sodium Other (See Comments)   Keflex [Cephalexin] Palpitations   Allergies were reviewed by me prior to the procedure  Objective   Body mass index is 19.75 kg/m. Vitals:   06/27/23 0719  BP: (!) 139/56  Pulse: 65  Resp: 16  Temp: (!) 96.9 F (36.1 C)  TempSrc: Temporal  SpO2: 100%  Weight: 49 kg  Height: 5\' 2"  (1.575 m)     Physical Exam Vitals and nursing note reviewed.  Constitutional:       General: She is not in acute distress.    Appearance: Normal appearance. She is not ill-appearing, toxic-appearing or diaphoretic.  HENT:     Head: Normocephalic and atraumatic.     Nose: Nose normal.     Mouth/Throat:     Mouth: Mucous membranes are moist.     Pharynx: Oropharynx is clear.  Eyes:     General: No scleral icterus.    Extraocular Movements: Extraocular movements intact.  Cardiovascular:     Rate and Rhythm: Normal rate and regular rhythm.     Heart sounds: Normal heart sounds. No murmur heard.    No friction rub. No gallop.  Pulmonary:     Effort: Pulmonary effort is normal. No respiratory distress.     Breath sounds: Normal breath sounds. No wheezing, rhonchi or rales.  Abdominal:     General: Abdomen is flat. Bowel sounds are normal. There is no distension.     Palpations: Abdomen is soft.  Tenderness: There is no abdominal tenderness. There is no guarding or rebound.     Comments: Insulin pump in place  Musculoskeletal:     Cervical back: Neck supple.     Right lower leg: No edema.     Left lower leg: No edema.  Skin:    General: Skin is warm and dry.     Coloration: Skin is not jaundiced or pale.  Neurological:     General: No focal deficit present.     Mental Status: She is alert and oriented to person, place, and time. Mental status is at baseline.  Psychiatric:        Mood and Affect: Mood normal.        Behavior: Behavior normal.        Thought Content: Thought content normal.        Judgment: Judgment normal.      Assessment:  Ms. YANEISY WENZ is a 58 y.o. female  who presents today for Esophagogastroduodenoscopy and Colonoscopy for dysphagia, gerd, colorectal cancer screening .  Plan:  Esophagogastroduodenoscopy and Colonoscopy with possible intervention today  Esophagogastroduodenoscopy and Colonoscopy with possible biopsy, control of bleeding, polypectomy, and interventions as necessary has been discussed with the patient/patient  representative. Informed consent was obtained from the patient/patient representative after explaining the indication, nature, and risks of the procedure including but not limited to death, bleeding, perforation, missed neoplasm/lesions, cardiorespiratory compromise, and reaction to medications. Opportunity for questions was given and appropriate answers were provided. Patient/patient representative has verbalized understanding is amenable to undergoing the procedure.   Jaynie Collins, DO  [facility] Gastroenterology  Portions of the record may have been created with voice recognition software. Occasional wrong-word or 'sound-a-like' substitutions may have occurred due to the inherent limitations of voice recognition software.  Read the chart carefully and recognize, using context, where substitutions may have occurred.

## 2023-06-27 ENCOUNTER — Encounter
Admission: RE | Disposition: A | Discharge status: Home / Self Care | Payer: Self-pay | Source: Home / Self Care | Attending: Gastroenterology

## 2023-06-27 ENCOUNTER — Ambulatory Visit
Admission: RE | Admit: 2023-06-27 | Discharge: 2023-06-27 | Disposition: A | Discharge status: Home / Self Care | Payer: 59 | Attending: Gastroenterology | Admitting: Gastroenterology

## 2023-06-27 ENCOUNTER — Ambulatory Visit: Admitting: Anesthesiology

## 2023-06-27 ENCOUNTER — Other Ambulatory Visit: Payer: Self-pay

## 2023-06-27 ENCOUNTER — Encounter: Payer: Self-pay | Admitting: Gastroenterology

## 2023-06-27 DIAGNOSIS — K219 Gastro-esophageal reflux disease without esophagitis: Secondary | ICD-10-CM | POA: Diagnosis not present

## 2023-06-27 DIAGNOSIS — K861 Other chronic pancreatitis: Secondary | ICD-10-CM | POA: Insufficient documentation

## 2023-06-27 DIAGNOSIS — K2289 Other specified disease of esophagus: Secondary | ICD-10-CM | POA: Diagnosis not present

## 2023-06-27 DIAGNOSIS — K224 Dyskinesia of esophagus: Secondary | ICD-10-CM | POA: Diagnosis not present

## 2023-06-27 DIAGNOSIS — K449 Diaphragmatic hernia without obstruction or gangrene: Secondary | ICD-10-CM | POA: Insufficient documentation

## 2023-06-27 DIAGNOSIS — Z8541 Personal history of malignant neoplasm of cervix uteri: Secondary | ICD-10-CM | POA: Diagnosis not present

## 2023-06-27 DIAGNOSIS — G4730 Sleep apnea, unspecified: Secondary | ICD-10-CM | POA: Insufficient documentation

## 2023-06-27 DIAGNOSIS — Z87891 Personal history of nicotine dependence: Secondary | ICD-10-CM | POA: Diagnosis not present

## 2023-06-27 DIAGNOSIS — I10 Essential (primary) hypertension: Secondary | ICD-10-CM | POA: Diagnosis not present

## 2023-06-27 DIAGNOSIS — R1310 Dysphagia, unspecified: Secondary | ICD-10-CM | POA: Diagnosis present

## 2023-06-27 DIAGNOSIS — E109 Type 1 diabetes mellitus without complications: Secondary | ICD-10-CM | POA: Diagnosis not present

## 2023-06-27 DIAGNOSIS — Z9221 Personal history of antineoplastic chemotherapy: Secondary | ICD-10-CM | POA: Insufficient documentation

## 2023-06-27 DIAGNOSIS — K2970 Gastritis, unspecified, without bleeding: Secondary | ICD-10-CM | POA: Diagnosis not present

## 2023-06-27 DIAGNOSIS — Z923 Personal history of irradiation: Secondary | ICD-10-CM | POA: Insufficient documentation

## 2023-06-27 DIAGNOSIS — Z1211 Encounter for screening for malignant neoplasm of colon: Secondary | ICD-10-CM | POA: Diagnosis not present

## 2023-06-27 DIAGNOSIS — Z9641 Presence of insulin pump (external) (internal): Secondary | ICD-10-CM | POA: Diagnosis not present

## 2023-06-27 DIAGNOSIS — Z794 Long term (current) use of insulin: Secondary | ICD-10-CM | POA: Insufficient documentation

## 2023-06-27 HISTORY — DX: Osteoarthritis of knee, unspecified: M17.9

## 2023-06-27 HISTORY — PX: MALONEY DILATION: SHX5535

## 2023-06-27 HISTORY — PX: ESOPHAGOGASTRODUODENOSCOPY: SHX5428

## 2023-06-27 HISTORY — DX: Displaced bimalleolar fracture of right lower leg, initial encounter for closed fracture: S82.841A

## 2023-06-27 HISTORY — PX: COLONOSCOPY: SHX5424

## 2023-06-27 LAB — GLUCOSE, CAPILLARY: Glucose-Capillary: 77 mg/dL (ref 70–99)

## 2023-06-27 SURGERY — COLONOSCOPY
Anesthesia: General

## 2023-06-27 MED ORDER — LIDOCAINE HCL (PF) 2 % IJ SOLN
INTRAMUSCULAR | Status: AC
  Filled 2023-06-27: qty 5

## 2023-06-27 MED ORDER — PROPOFOL 10 MG/ML IV BOLUS
INTRAVENOUS | Status: DC | PRN
  Administered 2023-06-27: 50 mg via INTRAVENOUS

## 2023-06-27 MED ORDER — PROPOFOL 1000 MG/100ML IV EMUL
INTRAVENOUS | Status: AC
  Filled 2023-06-27: qty 100

## 2023-06-27 MED ORDER — LIDOCAINE HCL (CARDIAC) PF 100 MG/5ML IV SOSY
PREFILLED_SYRINGE | INTRAVENOUS | Status: DC | PRN
  Administered 2023-06-27: 50 mg via INTRAVENOUS

## 2023-06-27 MED ORDER — SODIUM CHLORIDE 0.9 % IV SOLN
INTRAVENOUS | Status: DC
Start: 2023-06-27 — End: 2023-06-27

## 2023-06-27 MED ORDER — PROPOFOL 500 MG/50ML IV EMUL
INTRAVENOUS | Status: DC | PRN
  Administered 2023-06-27: 150 ug/kg/min via INTRAVENOUS

## 2023-06-27 NOTE — Op Note (Signed)
 [facility] Patient Name: Catherine Krueger Procedure Date: 06/27/2023 7:35 AM MRN: [id number] Account #: [id number] Date of Birth: 1965/05/31 Admit Type: Outpatient Age: 58 Room: [facility] ENDO ROOM 2 Gender: Female Note Status: Finalized Instrument Name: Upper Endoscope [id number] Procedure:             Upper GI endoscopy Indications:           Dysphagia Providers:             Trenda Moots, DO Referring MD:          Jaynie Collins DO, DO (Referring MD), No Local                         Md, MD (Referring MD) Medicines:             Monitored Anesthesia Care Complications:         No immediate complications. Estimated blood loss: None. Procedure:             Pre-Anesthesia Assessment:                        - Prior to the procedure, a History and Physical was                         performed, and patient medications and allergies were                         reviewed. The patient is competent. The risks and                         benefits of the procedure and the sedation options and                         risks were discussed with the patient. All questions                         were answered and informed consent was obtained.                         Patient identification and proposed procedure were                         verified by the physician, the nurse, the anesthetist                         and the technician in the endoscopy suite. Mental                         Status Examination: alert and oriented. Airway                         Examination: normal oropharyngeal airway and neck                         mobility. Respiratory Examination: clear to                         auscultation. CV Examination: RRR, no murmurs, no S3  or S4. Prophylactic Antibiotics: The patient does not                         require prophylactic antibiotics. Prior                         Anticoagulants: The patient has taken no  anticoagulant                         or antiplatelet agents. ASA Grade Assessment: III - A                         patient with severe systemic disease. After reviewing                         the risks and benefits, the patient was deemed in                         satisfactory condition to undergo the procedure. The                         anesthesia plan was to use monitored anesthesia care                         (MAC). Immediately prior to administration of                         medications, the patient was re-assessed for adequacy                         to receive sedatives. The heart rate, respiratory                         rate, oxygen saturations, blood pressure, adequacy of                         pulmonary ventilation, and response to care were                         monitored throughout the procedure. The physical                         status of the patient was re-assessed after the                         procedure.                        After obtaining informed consent, the endoscope was                         passed under direct vision. Throughout the procedure,                         the patient's blood pressure, pulse, and oxygen                         saturations were monitored continuously. The  Endosonoscope was introduced through the mouth, and                         advanced to the third part of duodenum. The upper GI                         endoscopy was accomplished without difficulty. The                         patient tolerated the procedure well. Findings:      The duodenal bulb, first portion of the duodenum, second portion of the       duodenum and third portion of the duodenum were normal. Estimated blood       loss: none.      Diffuse moderate inflammation characterized by erosions and erythema was       found in the entire examined stomach. Previously biopsied in 2023 for       similar appearance with chronic findings.  No repeat biopsies performed.       Estimated blood loss: none.      A 2 cm hiatal hernia was present. Estimated blood loss: none.      Esophagogastric landmarks were identified: the gastroesophageal junction       was found at 35 cm from the incisors.      The Z-line was variable and was found <1cm in length. biopsies not       performed. Estimated blood loss: none.      Abnormal motility was noted in the esophagus. The cricopharyngeus was       normal. There is spasticity of the esophageal body. The distal       esophagus/lower esophageal sphincter is open. The scope was withdrawn.       Dilation was performed with a Maloney dilator with no resistance at 52       Fr. The dilation site was examined following endoscope reinsertion and       showed no change. Estimated blood loss: none.      The exam of the esophagus was otherwise normal. Impression:            - Normal duodenal bulb, first portion of the duodenum,                         second portion of the duodenum and third portion of                         the duodenum.                        - Gastritis.                        - 2 cm hiatal hernia.                        - Esophagogastric landmarks identified.                        - Z-line variable, <1cm in length. biopsies not                         performed.                        -  Abnormal esophageal motility, suspicious for                         presbyesophagus. Dilated.                        - No specimens collected. Recommendation:        - Patient has a contact number available for                         emergencies. The signs and symptoms of potential                         delayed complications were discussed with the patient.                         Return to normal activities tomorrow. Written                         discharge instructions were provided to the patient.                        - Discharge patient to home.                        - Soft diet.                         - Continue present medications.                        - Repeat upper endoscopy PRN for retreatment.                        - Return to referring physician as previously                         scheduled.                        - The findings and recommendations were discussed with                         the patient. Procedure Code(s):     --- Professional ---                        [phone], Esophagogastroduodenoscopy, flexible,                         transoral; diagnostic, including collection of                         specimen(s) by brushing or washing, when performed                         (separate procedure)                        43450, Dilation of esophagus, by unguided sound or                         bougie, single or  multiple passes Diagnosis Code(s):     --- Professional ---                        K29.70, Gastritis, unspecified, without bleeding                        K44.9, Diaphragmatic hernia without obstruction or                         gangrene                        K22.89, Other specified disease of esophagus                        K22.4, Dyskinesia of esophagus                        R13.10, Dysphagia, unspecified CPT copyright 2022 [employer]. All rights reserved. The codes documented in this report are preliminary and upon coder review may  be revised to meet current compliance requirements. Attending Participation:      I personally performed the entire procedure. Elfredia Nevins, DO Jaynie Collins DO, DO 06/27/2023 7:53:17 AM This report has been signed electronically. Number of Addenda: 0 Note Initiated On: 06/27/2023 7:35 AM Estimated Blood Loss:  Estimated blood loss: none.      [facility]

## 2023-06-27 NOTE — Interval H&P Note (Signed)
 History and Physical Interval Note: Preprocedure H&P from 06/27/23  was reviewed and there was no interval change after seeing and examining the patient.  Written consent was obtained from the patient after discussion of risks, benefits, and alternatives. 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  The various methods of treatment have been discussed with the patient and family. After consideration of risks, benefits and other options for treatment, the patient has consented to  Procedure(s): COLONOSCOPY (N/A) ESOPHAGOGASTRODUODENOSCOPY (EGD) (N/A) as a surgical intervention.  The patient's history has been reviewed, patient examined, no change in status, stable for surgery.  I have reviewed the patient's chart and labs.  Questions were answered to the patient's satisfaction.     Jaynie Collins

## 2023-06-27 NOTE — Anesthesia Preprocedure Evaluation (Signed)
 Anesthesia Evaluation  Patient identified by MRN, date of birth, ID band Patient awake    Reviewed: Allergy & Precautions, NPO status , Patient's Chart, lab work & pertinent test results  History of Anesthesia Complications Negative for: history of anesthetic complications  Airway Mallampati: II  TM Distance: >3 FB Neck ROM: Full    Dental  (+) Edentulous Upper, Edentulous Lower   Pulmonary sleep apnea , neg COPD, Current Smoker and Patient abstained from smoking.   Pulmonary exam normal breath sounds clear to auscultation       Cardiovascular Exercise Tolerance: Good METShypertension, Pt. on medications (-) CAD and (-) Past MI (-) dysrhythmias + Cardiac Defibrillator  Rhythm:Regular Rate:Normal - Systolic murmurs    Neuro/Psych  PSYCHIATRIC DISORDERS  Depression    negative neurological ROS     GI/Hepatic ,GERD  ,,(+)     (-) substance abuse    Endo/Other  diabetes, Well Controlled, Type 1, Insulin Dependent    Renal/GU negative Renal ROS     Musculoskeletal   Abdominal   Peds  Hematology   Anesthesia Other Findings Past Medical History: No date: Arthritis No date: Cervical cancer (HCC)     Comment:  remission, dx at age 37 No date: Chronic pancreatitis (HCC) No date: Closed bimalleolar fracture of right ankle No date: Complication of anesthesia No date: Depression No date: Diabetes mellitus, type II (HCC) No date: GERD (gastroesophageal reflux disease) 2013: H. pylori infection No date: HLD (hyperlipidemia) No date: Hypertension No date: Insulin pump in place No date: Left foot drop No date: Lumbar radiculopathy No date: Osteoarthritis of knee No date: Peripheral neuropathy due to chemotherapy (HCC) No date: Personal history of chemotherapy No date: Personal history of radiation therapy No date: Sleep apnea  Reproductive/Obstetrics                             Anesthesia  Physical Anesthesia Plan  ASA: 3  Anesthesia Plan: General   Post-op Pain Management: Minimal or no pain anticipated   Induction: Intravenous  PONV Risk Score and Plan: 2 and Propofol infusion, TIVA and Ondansetron  Airway Management Planned: Nasal Cannula  Additional Equipment: None  Intra-op Plan:   Post-operative Plan:   Informed Consent: I have reviewed the patients History and Physical, chart, labs and discussed the procedure including the risks, benefits and alternatives for the proposed anesthesia with the patient or authorized representative who has indicated his/her understanding and acceptance.     Dental advisory given  Plan Discussed with: CRNA and Surgeon  Anesthesia Plan Comments: (Discussed risks of anesthesia with patient, including possibility of difficulty with spontaneous ventilation under anesthesia necessitating airway intervention, PONV, and rare risks such as cardiac or respiratory or neurological events, and allergic reactions. Discussed the role of CRNA in patient's perioperative care. Patient understands. Patient counseled on benefits of smoking cessation, and increased perioperative risks associated with continued smoking. )       Anesthesia Quick Evaluation

## 2023-06-27 NOTE — OR Nursing (Signed)
 Pt not clean, solid stool in pt brief, colonoscopy not attempted.

## 2023-06-27 NOTE — Anesthesia Postprocedure Evaluation (Signed)
 Anesthesia Post Note  Patient: Catherine Krueger  Procedure(s) Performed: COLONOSCOPY ESOPHAGOGASTRODUODENOSCOPY (EGD) MALONEY DILATION  Patient location during evaluation: Endoscopy Anesthesia Type: General Level of consciousness: awake and alert Pain management: pain level controlled Vital Signs Assessment: post-procedure vital signs reviewed and stable Respiratory status: spontaneous breathing, nonlabored ventilation, respiratory function stable and patient connected to nasal cannula oxygen Cardiovascular status: blood pressure returned to baseline and stable Postop Assessment: no apparent nausea or vomiting Anesthetic complications: no   There were no known notable events for this encounter.   Last Vitals:  Vitals:   06/27/23 0814 06/27/23 0824  BP: (!) 124/96   Pulse:  75  Resp:    Temp:    SpO2:  100%    Last Pain:  Vitals:   06/27/23 0824  TempSrc:   PainSc: 0-No pain                 Corinda Gubler

## 2023-06-27 NOTE — Care Plan (Signed)
 Brief GI Post Op Note  Patient underwent EGD today. In preparation for colonoscopy procedure, patient was noted to have brown, semi solid stool present in the diaper she was wearing. Due to this, the colonoscopy was canceled and not initiated.  Pt will need to be rescheduled with proper preparation. She did not complete the suprep (second bottle) overnight.  Enis Slipper, DO [facility]

## 2023-06-27 NOTE — Transfer of Care (Signed)
 Immediate Anesthesia Transfer of Care Note  Patient: Catherine Krueger  Procedure(s) Performed: COLONOSCOPY ESOPHAGOGASTRODUODENOSCOPY (EGD) MALONEY DILATION  Patient Location: PACU  Anesthesia Type:General  Level of Consciousness: awake and sedated  Airway & Oxygen Therapy: Patient Spontanous Breathing and Patient connected to nasal cannula oxygen  Post-op Assessment: Report given to RN and Post -op Vital signs reviewed and stable  Post vital signs: Reviewed and stable  Last Vitals:  Vitals Value Taken Time  BP 114/88 06/27/23 0754  Temp 36.1 C 06/27/23 0754  Pulse 59 06/27/23 0755  Resp 15 06/27/23 0755  SpO2 100 % 06/27/23 0755  Vitals shown include unfiled device data.  Last Pain:  Vitals:   06/27/23 0754  TempSrc: Temporal  PainSc: Asleep         Complications: There were no known notable events for this encounter.

## 2023-06-28 ENCOUNTER — Encounter: Payer: Self-pay | Admitting: Gastroenterology

## 2023-10-03 IMAGING — MG MM DIGITAL SCREENING BILAT W/ TOMO AND CAD
8 series · 8 of 24 positions shown · non-contrast
Comparison: Previous exam(s).

CLINICAL DATA: Screening.

EXAM:
DIGITAL SCREENING BILATERAL MAMMOGRAM WITH TOMOSYNTHESIS AND CAD
TECHNIQUE: Bilateral screening digital craniocaudal and mediolateral oblique
mammograms were obtained. Bilateral screening digital breast
tomosynthesis was performed. The images were evaluated with
computer-aided detection.

[R MLO synth-2D]
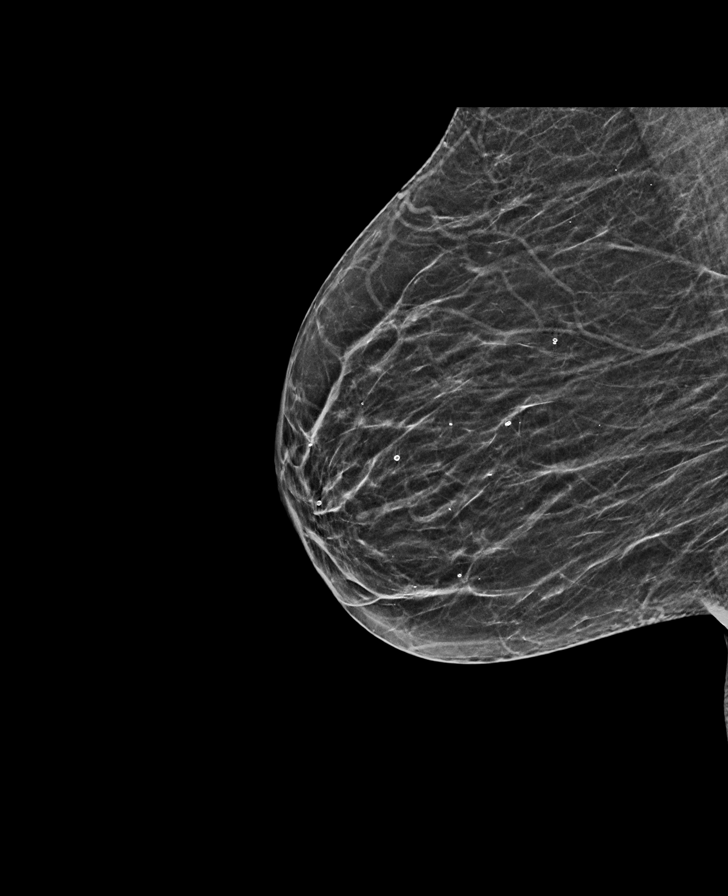

[L MLO synth-2D]
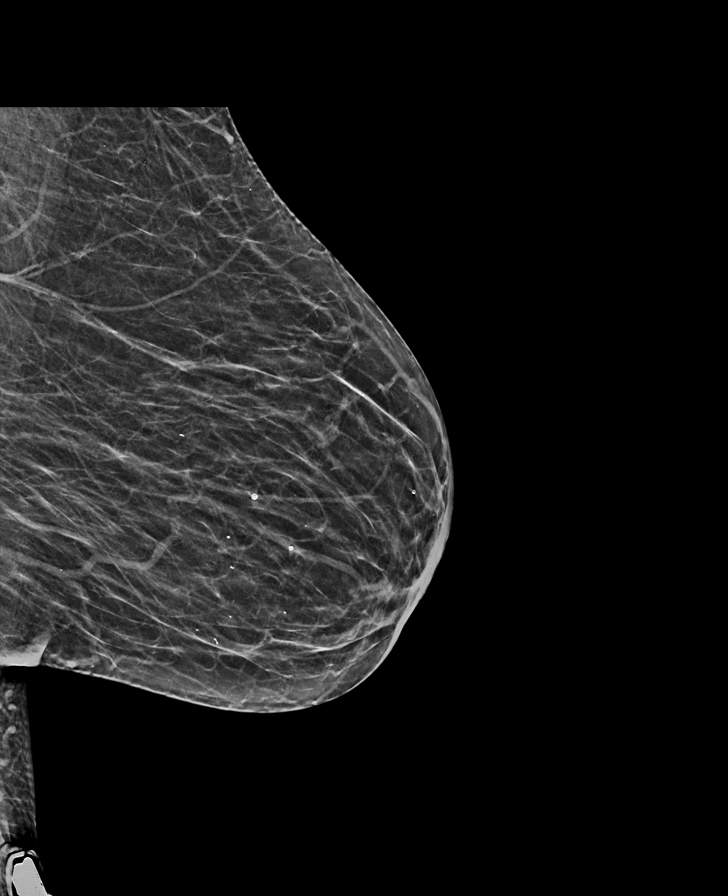

[L CC synth-2D]
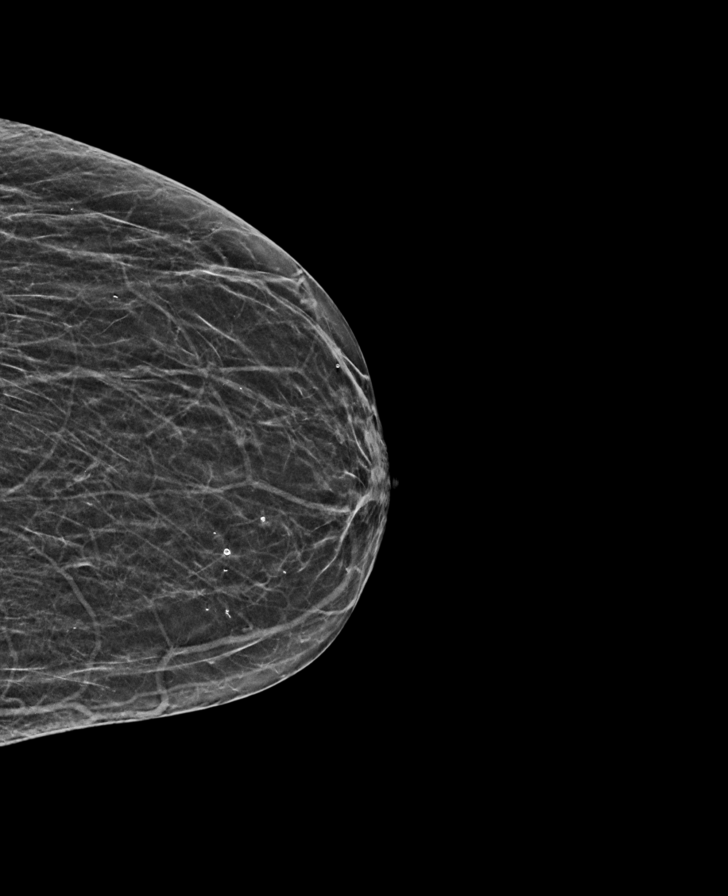

[R CC synth-2D]
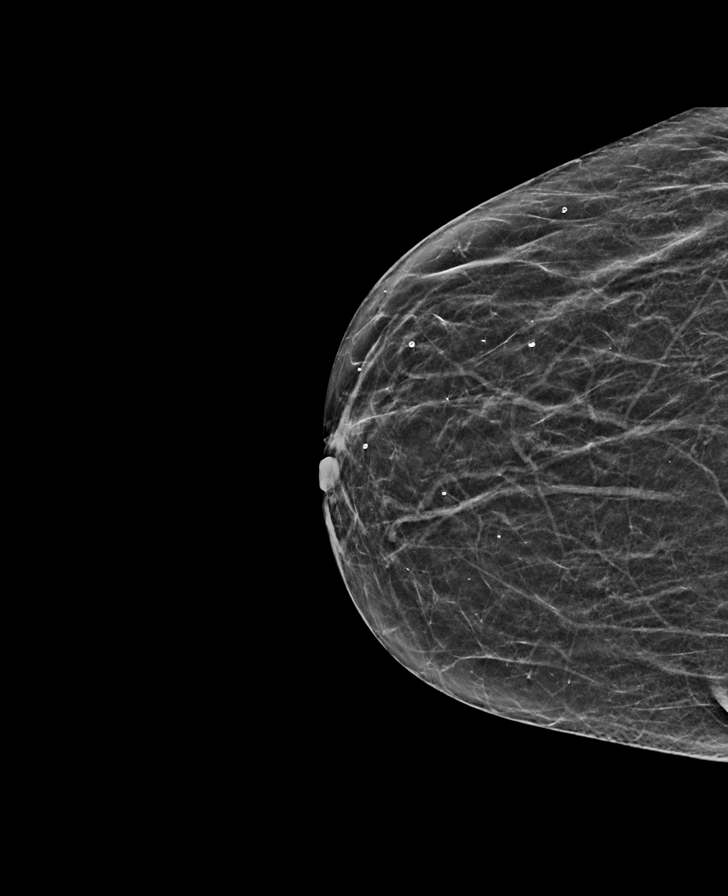

[R MLO tomo · tomo slice 23/46.0]
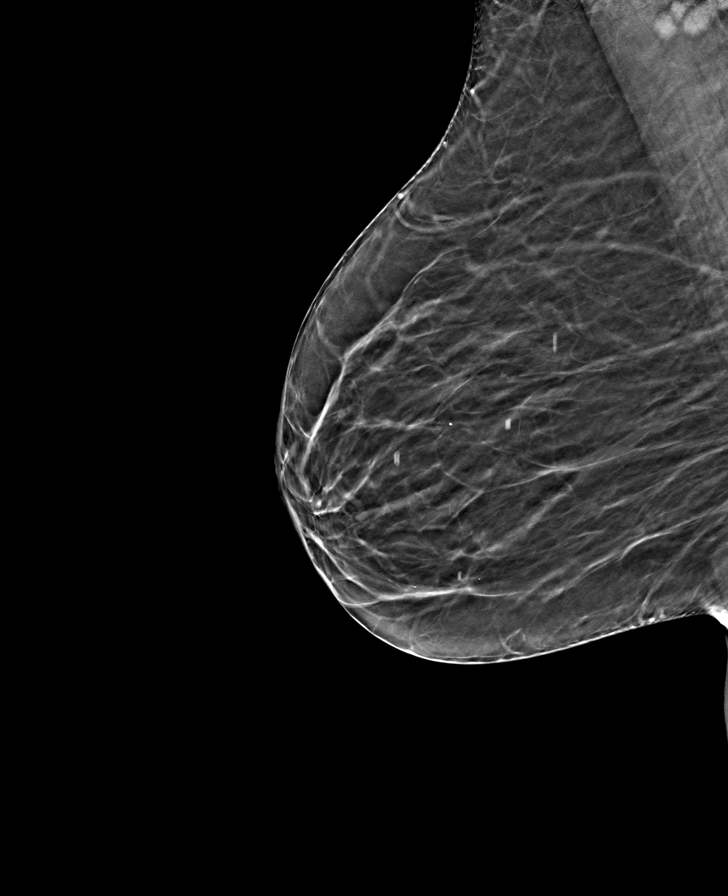

[R CC tomo · tomo slice 22/43.0]
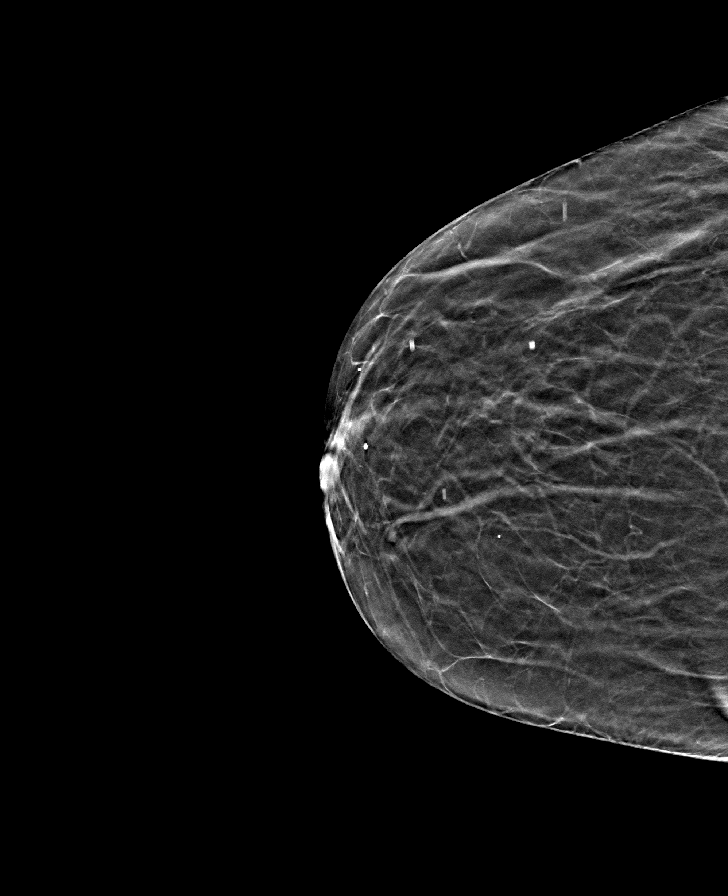

[L MLO tomo · tomo slice 23/45.0]
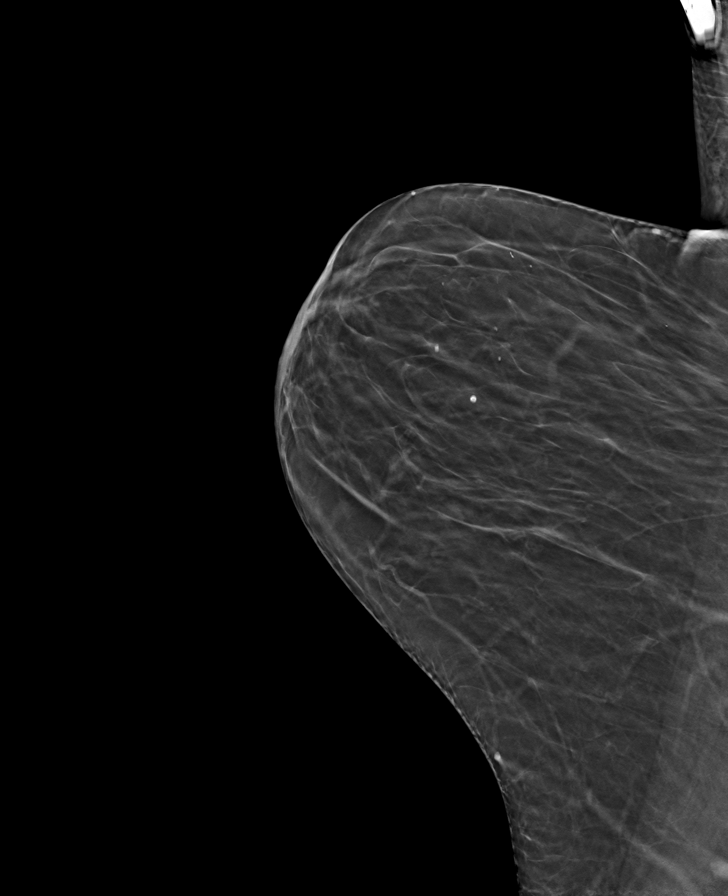

[L CC tomo · tomo slice 21/41.0]
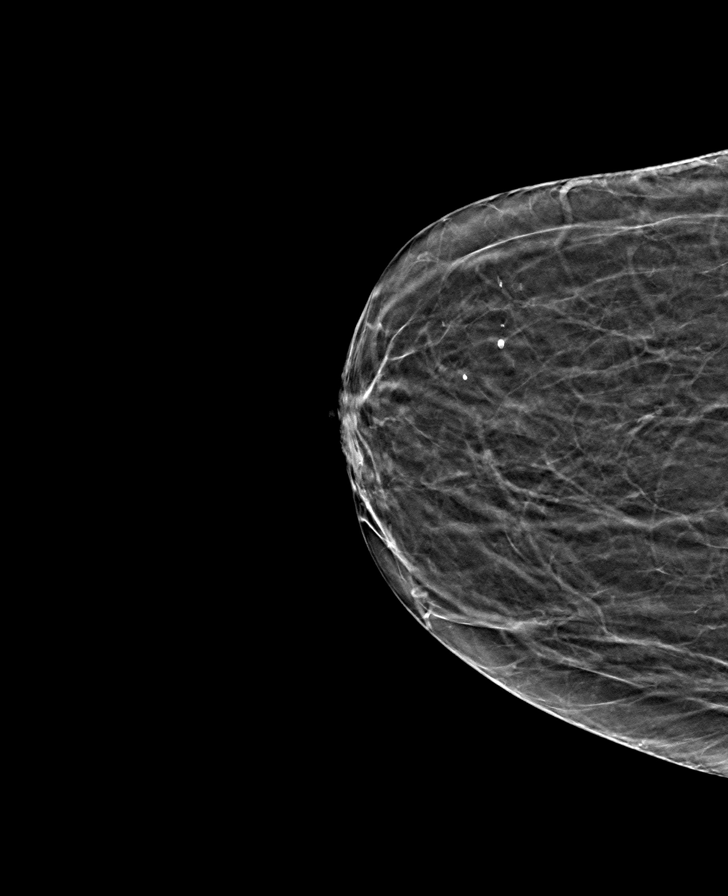

[8 of 24 positions shown; findings below may reference images not displayed]

ACR Breast Density Category b: There are scattered areas of
fibroglandular density.
FINDINGS: There are no findings suspicious for malignancy.
IMPRESSION: No mammographic evidence of malignancy. A result letter of this
screening mammogram will be mailed directly to the patient.

RECOMMENDATION:
Screening mammogram in one year. (Code:51-O-LD2)

BI-RADS CATEGORY  1: Negative.
